# Patient Record
Sex: Male | Born: 1940 | Race: White | Hispanic: No | Marital: Single | Smoking: Former smoker
Health system: Southern US, Community
[De-identification: ages and names within clinical notes are randomized; demographics above are authoritative.]

## PROBLEM LIST (undated history)

## (undated) DIAGNOSIS — E785 Hyperlipidemia, unspecified: Secondary | ICD-10-CM

## (undated) DIAGNOSIS — I10 Essential (primary) hypertension: Secondary | ICD-10-CM

## (undated) DIAGNOSIS — I2510 Atherosclerotic heart disease of native coronary artery without angina pectoris: Secondary | ICD-10-CM

## (undated) DIAGNOSIS — M1990 Unspecified osteoarthritis, unspecified site: Secondary | ICD-10-CM

## (undated) DIAGNOSIS — I219 Acute myocardial infarction, unspecified: Secondary | ICD-10-CM

## (undated) HISTORY — PX: APPENDECTOMY: SHX54

## (undated) HISTORY — PX: CARDIAC CATHETERIZATION: SHX172

## (undated) HISTORY — PX: CORONARY ANGIOPLASTY: SHX604

## (undated) HISTORY — PX: TONSILLECTOMY: SUR1361

## (undated) HISTORY — PX: CORONARY ARTERY BYPASS GRAFT: SHX141

---

## 1999-06-10 DIAGNOSIS — I219 Acute myocardial infarction, unspecified: Secondary | ICD-10-CM | POA: Insufficient documentation

## 1999-06-10 HISTORY — DX: Acute myocardial infarction, unspecified: I21.9

## 2015-11-25 DIAGNOSIS — M25569 Pain in unspecified knee: Secondary | ICD-10-CM

## 2015-11-25 HISTORY — DX: Pain in unspecified knee: M25.569

## 2015-11-29 DIAGNOSIS — I452 Bifascicular block: Secondary | ICD-10-CM | POA: Insufficient documentation

## 2015-11-29 HISTORY — DX: Bifascicular block: I45.2

## 2015-12-09 DIAGNOSIS — Z01810 Encounter for preprocedural cardiovascular examination: Secondary | ICD-10-CM | POA: Insufficient documentation

## 2015-12-09 HISTORY — DX: Encounter for preprocedural cardiovascular examination: Z01.810

## 2015-12-17 ENCOUNTER — Other Ambulatory Visit: Payer: Self-pay | Admitting: Orthopedic Surgery

## 2016-01-12 NOTE — Pre-Procedure Instructions (Signed)
Gabriel MccoyGeorge N Wade  01/12/2016     Your procedure is scheduled on : Monday January 24, 2016 at 9:35 AM.  [facility] to [facility] Admitting at 7:35 AM.  Call this number if you have problems the morning of surgery: [phone]    Remember:  Do not eat food or drink liquids after midnight.  Take these medicines the morning of surgery with A SIP OF WATER : NONE   Stop taking any vitamins, herbal medications, NSAIDs, Ibuprofen, Advil, Motrin, Aleve, Omega 3/Fish Oil, Ubiquinol, etc on Monday July 10th   Do not wear jewelry.  Do not wear lotions, powders, or cologne.    Men may shave face and neck.  Do not bring valuables to the hospital.  [facility] is not responsible for any belongings or valuables.  Contacts, dentures or bridgework may not be worn into surgery.  Leave your suitcase in the car.  After surgery it may be brought to your room.  For patients admitted to the hospital, discharge time will be determined by your treatment team.  Patients discharged the day of surgery will not be allowed to drive home.   Name and phone number of your driver:    Special instructions:  Shower using CHG soap the night before and the morning of your surgery  Please read over the following fact sheets that you were given. MRSA Information

## 2016-01-13 ENCOUNTER — Encounter (HOSPITAL_COMMUNITY)
Admission: RE | Admit: 2016-01-13 | Discharge: 2016-01-13 | Disposition: A | Discharge status: Home / Self Care | Payer: Medicare Other | Source: Ambulatory Visit | Attending: Orthopedic Surgery | Admitting: Orthopedic Surgery

## 2016-01-13 ENCOUNTER — Encounter (HOSPITAL_COMMUNITY): Payer: Self-pay

## 2016-01-13 ENCOUNTER — Ambulatory Visit (HOSPITAL_COMMUNITY)
Admission: RE | Admit: 2016-01-13 | Discharge: 2016-01-13 | Disposition: A | Discharge status: Home / Self Care | Payer: Medicare Other | Source: Ambulatory Visit | Attending: Orthopedic Surgery | Admitting: Orthopedic Surgery

## 2016-01-13 DIAGNOSIS — Z01818 Encounter for other preprocedural examination: Secondary | ICD-10-CM

## 2016-01-13 DIAGNOSIS — Z01812 Encounter for preprocedural laboratory examination: Secondary | ICD-10-CM | POA: Insufficient documentation

## 2016-01-13 HISTORY — DX: Essential (primary) hypertension: I10

## 2016-01-13 HISTORY — DX: Unspecified osteoarthritis, unspecified site: M19.90

## 2016-01-13 HISTORY — DX: Acute myocardial infarction, unspecified: I21.9

## 2016-01-13 HISTORY — DX: Hyperlipidemia, unspecified: E78.5

## 2016-01-13 HISTORY — DX: Atherosclerotic heart disease of native coronary artery without angina pectoris: I25.10

## 2016-01-13 LAB — COMPREHENSIVE METABOLIC PANEL
ALBUMIN: 4 g/dL (ref 3.5–5.0)
ALT: 23 U/L (ref 17–63)
AST: 30 U/L (ref 15–41)
Alkaline Phosphatase: 45 U/L (ref 38–126)
Anion gap: 10 (ref 5–15)
BILIRUBIN TOTAL: 0.8 mg/dL (ref 0.3–1.2)
BUN: 12 mg/dL (ref 6–20)
CHLORIDE: 102 mmol/L (ref 101–111)
CO2: 27 mmol/L (ref 22–32)
CREATININE: 1.15 mg/dL (ref 0.61–1.24)
Calcium: 9.8 mg/dL (ref 8.9–10.3)
GFR calc Af Amer: >60 mL/min (ref >60)
GLUCOSE: 128 mg/dL — AB (ref 65–99)
POTASSIUM: 3.7 mmol/L (ref 3.5–5.1)
Sodium: 139 mmol/L (ref 135–145)
Total Protein: 7.3 g/dL (ref 6.5–8.1)

## 2016-01-13 LAB — CBC WITH DIFFERENTIAL/PLATELET
BASOS ABS: 0 10*3/uL (ref 0.0–0.1)
BASOS PCT: 0 %
Eosinophils Absolute: 0.2 10*3/uL (ref 0.0–0.7)
Eosinophils Relative: 4 %
HEMATOCRIT: 40.2 % (ref 39.0–52.0)
Hemoglobin: 13.4 g/dL (ref 13.0–17.0)
LYMPHS PCT: 23 %
Lymphs Abs: 1.1 10*3/uL (ref 0.7–4.0)
MCH: 29.8 pg (ref 26.0–34.0)
MCHC: 33.3 g/dL (ref 30.0–36.0)
MCV: 89.5 fL (ref 78.0–100.0)
Monocytes Absolute: 0.4 10*3/uL (ref 0.1–1.0)
Monocytes Relative: 9 %
NEUTROS ABS: 3.3 10*3/uL (ref 1.7–7.7)
Neutrophils Relative %: 64 %
PLATELETS: 226 10*3/uL (ref 150–400)
RBC: 4.49 MIL/uL (ref 4.22–5.81)
RDW: 13.2 % (ref 11.5–15.5)
WBC: 5 10*3/uL (ref 4.0–10.5)

## 2016-01-13 LAB — URINALYSIS, ROUTINE W REFLEX MICROSCOPIC
Bilirubin Urine: NEGATIVE
GLUCOSE, UA: NEGATIVE mg/dL
Hgb urine dipstick: NEGATIVE
KETONES UR: NEGATIVE mg/dL
NITRITE: NEGATIVE
PH: 7 (ref 5.0–8.0)
Protein, ur: NEGATIVE mg/dL
SPECIFIC GRAVITY, URINE: 1.017 (ref 1.005–1.030)

## 2016-01-13 LAB — URINE MICROSCOPIC-ADD ON
BACTERIA UA: NONE SEEN
RBC / HPF: NONE SEEN RBC/hpf (ref 0–5)
Squamous Epithelial / LPF: NONE SEEN

## 2016-01-13 LAB — SURGICAL PCR SCREEN
MRSA, PCR: NEGATIVE
STAPHYLOCOCCUS AUREUS: NEGATIVE

## 2016-01-13 LAB — PROTIME-INR
INR: 1.05 (ref 0.00–1.49)
PROTHROMBIN TIME: 13.9 s (ref 11.6–15.2)

## 2016-01-13 LAB — APTT: APTT: 27 s (ref 24–37)

## 2016-01-13 NOTE — Progress Notes (Signed)
PCP is [name], DO  Cardiologist is Norman Herr[place]Brian Munley [place]. Patient informed Nurse that he recently had a OV and EKG performed at Dr. Osker MasonMunely's office. Will request records.  Patient informed Nurse that he had a MI and CABG in 2000 at [facility]. Patient denied having any acute cardiac or pulmonary issues  Nurse inquired about aspirin, and patient informed Nurse that Dr. Dulce SellarMunley instructed patient to stop taking aspirin one week prior to surgery  Will send chart to Anesthesia for review

## 2016-01-14 LAB — URINE CULTURE: CULTURE: NO GROWTH

## 2016-01-14 NOTE — Progress Notes (Addendum)
Anesthesia Chart Review:  Pt is a 75 year old male scheduled for R unicompartmental knee on 01/24/2016 with Dannielle HuhSteve Lucey, MD.   PMH includes:  CAD (s/p MI, CABG 2001), HTN, hyperlipidemia. Never smoker. BMI 28  Medications include: ASA, hctz, lisinopril, simvastatin  Preoperative labs reviewed.    Chest x-ray 01/13/16 reviewed. No active cardiopulmonary disease  EKG 12/10/15: sinus rhythm. RBBB. Anterior infarct, age undetermined.   Pt does not routinely see cardiology but saw Norman HerrlichBrian Munley, MD (care everywhere) and was cleared for surgery at low risk.   Exercise stress test 08/26/13: Baseline NSR with RBBB. Pt exercised 3.5 min BRUCE to HR 146/min, >100% MPHR. No angina or ST depression. Normal BP response. Occasional PVC.   If no changes, I anticipate pt can proceed with surgery as scheduled.   Rica Mastngela Zoha Spranger, FNP-BC [facility]/Anesthesiology Phone: [phone] 01/17/2016 12:20 PM

## 2016-01-21 MED ORDER — CEFAZOLIN SODIUM-DEXTROSE 2-4 GM/100ML-% IV SOLN
2.0000 g | INTRAVENOUS | Status: AC
  Administered 2016-01-24: 2 g via INTRAVENOUS
  Filled 2016-01-21: qty 100

## 2016-01-21 MED ORDER — BUPIVACAINE LIPOSOME 1.3 % IJ SUSP
20.0000 mL | INTRAMUSCULAR | Status: AC
  Administered 2016-01-24: 20 mL
  Filled 2016-01-21: qty 20

## 2016-01-21 MED ORDER — TRANEXAMIC ACID 1000 MG/10ML IV SOLN
1000.0000 mg | INTRAVENOUS | Status: AC
  Administered 2016-01-24: 1000 mg via INTRAVENOUS
  Filled 2016-01-21: qty 10

## 2016-01-21 MED ORDER — ACETAMINOPHEN 500 MG PO TABS
1000.0000 mg | ORAL_TABLET | Freq: Once | ORAL | Status: AC
  Administered 2016-01-24: 1000 mg via ORAL
  Filled 2016-01-21: qty 2

## 2016-01-21 MED ORDER — SODIUM CHLORIDE 0.9 % IV SOLN: INTRAVENOUS | Status: DC

## 2016-01-24 ENCOUNTER — Ambulatory Visit (HOSPITAL_COMMUNITY): Payer: Medicare Other | Admitting: Anesthesiology

## 2016-01-24 ENCOUNTER — Encounter (HOSPITAL_COMMUNITY)
Admission: RE | Disposition: A | Discharge status: Home / Self Care | Payer: Self-pay | Source: Ambulatory Visit | Attending: Orthopedic Surgery

## 2016-01-24 ENCOUNTER — Encounter (HOSPITAL_COMMUNITY): Payer: Self-pay | Admitting: General Practice

## 2016-01-24 ENCOUNTER — Ambulatory Visit (HOSPITAL_COMMUNITY)
Admission: RE | Admit: 2016-01-24 | Discharge: 2016-01-24 | Disposition: A | Discharge status: Home / Self Care | Payer: Medicare Other | Source: Ambulatory Visit | Attending: Orthopedic Surgery | Admitting: Orthopedic Surgery

## 2016-01-24 ENCOUNTER — Ambulatory Visit (HOSPITAL_COMMUNITY): Payer: Medicare Other | Admitting: Emergency Medicine

## 2016-01-24 DIAGNOSIS — Z79899 Other long term (current) drug therapy: Secondary | ICD-10-CM | POA: Insufficient documentation

## 2016-01-24 DIAGNOSIS — M1711 Unilateral primary osteoarthritis, right knee: Secondary | ICD-10-CM | POA: Diagnosis present

## 2016-01-24 DIAGNOSIS — I2510 Atherosclerotic heart disease of native coronary artery without angina pectoris: Secondary | ICD-10-CM | POA: Insufficient documentation

## 2016-01-24 DIAGNOSIS — Z955 Presence of coronary angioplasty implant and graft: Secondary | ICD-10-CM | POA: Diagnosis not present

## 2016-01-24 DIAGNOSIS — I10 Essential (primary) hypertension: Secondary | ICD-10-CM | POA: Insufficient documentation

## 2016-01-24 DIAGNOSIS — Z96651 Presence of right artificial knee joint: Secondary | ICD-10-CM

## 2016-01-24 DIAGNOSIS — E785 Hyperlipidemia, unspecified: Secondary | ICD-10-CM | POA: Diagnosis not present

## 2016-01-24 DIAGNOSIS — I252 Old myocardial infarction: Secondary | ICD-10-CM | POA: Insufficient documentation

## 2016-01-24 HISTORY — PX: PARTIAL KNEE ARTHROPLASTY: SHX2174

## 2016-01-24 HISTORY — DX: Presence of right artificial knee joint: Z96.651

## 2016-01-24 SURGERY — ARTHROPLASTY, KNEE, UNICOMPARTMENTAL
Anesthesia: Monitor Anesthesia Care | Site: Knee | Laterality: Right

## 2016-01-24 MED ORDER — ONDANSETRON HCL 4 MG/2ML IJ SOLN
INTRAMUSCULAR | Status: AC
  Filled 2016-01-24: qty 2

## 2016-01-24 MED ORDER — SODIUM CHLORIDE 0.9 % IR SOLN
Status: DC | PRN
  Administered 2016-01-24: 3000 mL

## 2016-01-24 MED ORDER — MIDAZOLAM HCL 2 MG/2ML IJ SOLN
INTRAMUSCULAR | Status: AC
  Filled 2016-01-24: qty 2

## 2016-01-24 MED ORDER — TIZANIDINE HCL 4 MG PO TABS: 4.0000 mg | ORAL_TABLET | Freq: Four times a day (QID) | ORAL | Status: DC | PRN

## 2016-01-24 MED ORDER — FENTANYL CITRATE (PF) 250 MCG/5ML IJ SOLN
INTRAMUSCULAR | Status: AC
  Filled 2016-01-24: qty 5

## 2016-01-24 MED ORDER — PROPOFOL 500 MG/50ML IV EMUL
INTRAVENOUS | Status: DC | PRN
  Administered 2016-01-24: 90 ug/kg/min via INTRAVENOUS

## 2016-01-24 MED ORDER — EPHEDRINE SULFATE 50 MG/ML IJ SOLN
INTRAMUSCULAR | Status: DC | PRN
  Administered 2016-01-24: 5 mg via INTRAVENOUS

## 2016-01-24 MED ORDER — PHENYLEPHRINE HCL 10 MG/ML IJ SOLN
INTRAMUSCULAR | Status: DC | PRN
  Administered 2016-01-24 (×5): 80 ug via INTRAVENOUS

## 2016-01-24 MED ORDER — OXYCODONE HCL 5 MG PO TABS: 5.0000 mg | ORAL_TABLET | ORAL | Status: DC | PRN

## 2016-01-24 MED ORDER — 0.9 % SODIUM CHLORIDE (POUR BTL) OPTIME
TOPICAL | Status: DC | PRN
  Administered 2016-01-24: 1000 mL

## 2016-01-24 MED ORDER — PROPOFOL 10 MG/ML IV BOLUS
INTRAVENOUS | Status: AC
  Filled 2016-01-24: qty 20

## 2016-01-24 MED ORDER — LACTATED RINGERS IV SOLN
INTRAVENOUS | Status: DC
  Administered 2016-01-24 (×3): via INTRAVENOUS

## 2016-01-24 MED ORDER — CHLORHEXIDINE GLUCONATE 4 % EX LIQD: 60.0000 mL | Freq: Once | CUTANEOUS | Status: DC

## 2016-01-24 MED ORDER — PHENYLEPHRINE HCL 10 MG/ML IJ SOLN
10.0000 mg | INTRAMUSCULAR | Status: DC | PRN
Start: 2016-01-24 — End: 2016-01-24
  Administered 2016-01-24: 50 ug/min via INTRAVENOUS

## 2016-01-24 MED ORDER — BUPIVACAINE HCL 0.25 % IJ SOLN: INTRAMUSCULAR | Status: DC | PRN

## 2016-01-24 MED ORDER — HYDROMORPHONE HCL 1 MG/ML IJ SOLN: 0.2500 mg | INTRAMUSCULAR | Status: DC | PRN

## 2016-01-24 MED ORDER — PROMETHAZINE HCL 25 MG/ML IJ SOLN: 6.2500 mg | INTRAMUSCULAR | Status: DC | PRN

## 2016-01-24 MED ORDER — ARTIFICIAL TEARS OP OINT
TOPICAL_OINTMENT | OPHTHALMIC | Status: AC
  Filled 2016-01-24: qty 3.5

## 2016-01-24 MED ORDER — DIPHENHYDRAMINE HCL 50 MG/ML IJ SOLN
INTRAMUSCULAR | Status: DC | PRN
  Administered 2016-01-24: 25 mg via INTRAVENOUS

## 2016-01-24 MED ORDER — SODIUM CHLORIDE 0.9 % IJ SOLN
INTRAMUSCULAR | Status: DC | PRN
Start: 2016-01-24 — End: 2016-01-24
  Administered 2016-01-24: 20 mL

## 2016-01-24 MED ORDER — BUPIVACAINE-EPINEPHRINE 0.25% -1:200000 IJ SOLN
INTRAMUSCULAR | Status: DC | PRN
Start: 2016-01-24 — End: 2016-01-24
  Administered 2016-01-24: 30 mL

## 2016-01-24 MED ORDER — MIDAZOLAM HCL 5 MG/5ML IJ SOLN
INTRAMUSCULAR | Status: DC | PRN
  Administered 2016-01-24: 2 mg via INTRAVENOUS

## 2016-01-24 MED ORDER — BUPIVACAINE HCL (PF) 0.25 % IJ SOLN
INTRAMUSCULAR | Status: AC
  Filled 2016-01-24: qty 30

## 2016-01-24 MED ORDER — BUPIVACAINE-EPINEPHRINE (PF) 0.25% -1:200000 IJ SOLN
INTRAMUSCULAR | Status: AC
  Filled 2016-01-24: qty 30

## 2016-01-24 MED ORDER — ASPIRIN 325 MG PO TABS: 325.0000 mg | ORAL_TABLET | Freq: Every day | ORAL | Status: DC

## 2016-01-24 SURGICAL SUPPLY — 74 items
BANDAGE ELASTIC 6 VELCRO ST LF (GAUZE/BANDAGES/DRESSINGS) ×3 IMPLANT
BANDAGE ESMARK 6X9 LF (GAUZE/BANDAGES/DRESSINGS) ×1 IMPLANT
BEARING TIB UKA IBAL SZ3 10 (Knees) ×2 IMPLANT
BLADE SAW RECIP 87.9 MT (BLADE) IMPLANT
BLADE SAW SGTL 13X75X1.27 (BLADE) IMPLANT
BLADE SAW SGTL 83.5X18.5 (BLADE) IMPLANT
BNDG ELASTIC 6X10 VLCR STRL LF (GAUZE/BANDAGES/DRESSINGS) ×3 IMPLANT
BNDG ESMARK 6X9 LF (GAUZE/BANDAGES/DRESSINGS) ×3
BOWL SMART MIX CTS (DISPOSABLE) ×3 IMPLANT
CEMENT BONE SIMPLEX SPEEDSET (Cement) ×3 IMPLANT
CLOSURE WOUND 1/2 X4 (GAUZE/BANDAGES/DRESSINGS) ×1
COVER SURGICAL LIGHT HANDLE (MISCELLANEOUS) ×3 IMPLANT
CUFF TOURNIQUET SINGLE 34IN LL (TOURNIQUET CUFF) ×3 IMPLANT
DRAPE EXTREMITY T 121X128X90 (DRAPE) ×3 IMPLANT
DRAPE IMP U-DRAPE 54X76 (DRAPES) ×3 IMPLANT
DRAPE INCISE IOBAN 66X45 STRL (DRAPES) ×9 IMPLANT
DRAPE PROXIMA HALF (DRAPES) ×3 IMPLANT
DRAPE U-SHAPE 47X51 STRL (DRAPES) ×3 IMPLANT
DRSG ADAPTIC 3X8 NADH LF (GAUZE/BANDAGES/DRESSINGS) IMPLANT
DRSG AQUACEL AG ADV 3.5X10 (GAUZE/BANDAGES/DRESSINGS) ×3 IMPLANT
DRSG PAD ABDOMINAL 8X10 ST (GAUZE/BANDAGES/DRESSINGS) IMPLANT
DURAPREP 26ML APPLICATOR (WOUND CARE) ×6 IMPLANT
ELECT REM PT RETURN 9FT ADLT (ELECTROSURGICAL) ×3
ELECTRODE REM PT RTRN 9FT ADLT (ELECTROSURGICAL) ×1 IMPLANT
EVACUATOR 1/8 PVC DRAIN (DRAIN) IMPLANT
FEMORAL UKA IBAL RMED LLAT SZ3 (Knees) ×3 IMPLANT
FLUID NSS /IRRIG 3000 ML XXX (IV SOLUTION) ×3 IMPLANT
GAUZE SPONGE 4X4 12PLY STRL (GAUZE/BANDAGES/DRESSINGS) IMPLANT
GLOVE BIO SURGEON STRL SZ7 (GLOVE) ×3 IMPLANT
GLOVE BIOGEL M 7.0 STRL (GLOVE) IMPLANT
GLOVE BIOGEL PI IND STRL 6.5 (GLOVE) ×2 IMPLANT
GLOVE BIOGEL PI IND STRL 7.0 (GLOVE) ×1 IMPLANT
GLOVE BIOGEL PI IND STRL 7.5 (GLOVE) IMPLANT
GLOVE BIOGEL PI IND STRL 8.5 (GLOVE) ×4 IMPLANT
GLOVE BIOGEL PI INDICATOR 6.5 (GLOVE) ×4
GLOVE BIOGEL PI INDICATOR 7.0 (GLOVE) ×2
GLOVE BIOGEL PI INDICATOR 7.5 (GLOVE)
GLOVE BIOGEL PI INDICATOR 8.5 (GLOVE) ×8
GLOVE SURG ORTHO 8.0 STRL STRW (GLOVE) ×12 IMPLANT
GLOVE SURG SS PI 7.0 STRL IVOR (GLOVE) ×3 IMPLANT
GOWN STRL REUS W/ TWL LRG LVL3 (GOWN DISPOSABLE) ×2 IMPLANT
GOWN STRL REUS W/ TWL XL LVL3 (GOWN DISPOSABLE) ×1 IMPLANT
GOWN STRL REUS W/TWL 2XL LVL3 (GOWN DISPOSABLE) ×3 IMPLANT
GOWN STRL REUS W/TWL LRG LVL3 (GOWN DISPOSABLE) ×4
GOWN STRL REUS W/TWL XL LVL3 (GOWN DISPOSABLE) ×2
HANDPIECE INTERPULSE COAX TIP (DISPOSABLE) ×2
HOOD PEEL AWAY FACE SHEILD DIS (HOOD) ×9 IMPLANT
KIT BASIN OR (CUSTOM PROCEDURE TRAY) ×3 IMPLANT
KIT ROOM TURNOVER OR (KITS) ×3 IMPLANT
MANIFOLD NEPTUNE II (INSTRUMENTS) ×3 IMPLANT
NEEDLE 21X1 OR PACK (NEEDLE) ×3 IMPLANT
NEEDLE HYPO 21X1 ECLIPSE (NEEDLE) ×3 IMPLANT
NS IRRIG 1000ML POUR BTL (IV SOLUTION) ×3 IMPLANT
PACK TOTAL JOINT (CUSTOM PROCEDURE TRAY) ×3 IMPLANT
PACK UNIVERSAL I (CUSTOM PROCEDURE TRAY) ×3 IMPLANT
PAD ARMBOARD 7.5X6 YLW CONV (MISCELLANEOUS) ×6 IMPLANT
PADDING CAST COTTON 6X4 STRL (CAST SUPPLIES) IMPLANT
SET HNDPC FAN SPRY TIP SCT (DISPOSABLE) ×1 IMPLANT
STAPLER VISISTAT 35W (STAPLE) IMPLANT
STRIP CLOSURE SKIN 1/2X4 (GAUZE/BANDAGES/DRESSINGS) ×2 IMPLANT
SUCTION FRAZIER HANDLE 10FR (MISCELLANEOUS) ×2
SUCTION TUBE FRAZIER 10FR DISP (MISCELLANEOUS) ×1 IMPLANT
SUT MNCRL AB 3-0 PS2 18 (SUTURE) ×3 IMPLANT
SUT VIC AB 0 CT1 27 (SUTURE) ×4
SUT VIC AB 0 CT1 27XBRD ANBCTR (SUTURE) ×2 IMPLANT
SUT VIC AB 1 CT1 27 (SUTURE) ×4
SUT VIC AB 1 CT1 27XBRD ANBCTR (SUTURE) ×2 IMPLANT
SUT VIC AB 2-0 CT1 27 (SUTURE) ×4
SUT VIC AB 2-0 CT1 TAPERPNT 27 (SUTURE) ×2 IMPLANT
SYR 20CC LL (SYRINGE) ×6 IMPLANT
TOWEL OR 17X24 6PK STRL BLUE (TOWEL DISPOSABLE) ×3 IMPLANT
TOWEL OR 17X26 10 PK STRL BLUE (TOWEL DISPOSABLE) ×3 IMPLANT
TRAY CATH 16FR W/PLASTIC CATH (SET/KITS/TRAYS/PACK) ×3 IMPLANT
TRAY TIBIA COMP SZ3 IBALUKA RT (Knees) ×2 IMPLANT

## 2016-01-24 NOTE — Transfer of Care (Signed)
Immediate Anesthesia Transfer of Care Note  Patient: Gabriel MccoyGeorge N Wade  Procedure(s) Performed: Procedure(s): UNICOMPARTMENTAL KNEE (Right)  Patient Location: PACU  Anesthesia Type:MAC and Spinal  Level of Consciousness: awake, alert  and oriented  Airway & Oxygen Therapy: Patient Spontanous Breathing  Post-op Assessment: Report given to RN and Post -op Vital signs reviewed and stable  Post vital signs: Reviewed and stable  Last Vitals:  Filed Vitals:   01/24/16 1257 01/24/16 1300  BP: 94/58   Pulse: 88 90  Temp:    Resp:  26    Last Pain:  Filed Vitals:   01/24/16 1301  PainSc: 3       Patients Stated Pain Goal: 4 (01/24/16 [id number])  Complications: No apparent anesthesia complications

## 2016-01-24 NOTE — Anesthesia Preprocedure Evaluation (Signed)
Anesthesia Evaluation  Patient identified by MRN, date of birth, ID band Patient awake    Reviewed: Allergy & Precautions, NPO status , Patient's Chart, lab work & pertinent test results  History of Anesthesia Complications Negative for: history of anesthetic complications  Airway Mallampati: II  TM Distance: >3 FB Neck ROM: Full    Dental  (+) Teeth Intact, Dental Advisory Given   Pulmonary neg pulmonary ROS,    Pulmonary exam normal        Cardiovascular hypertension, + CAD, + Past MI and + CABG  Normal cardiovascular exam     Neuro/Psych negative neurological ROS  negative psych ROS   GI/Hepatic negative GI ROS, Neg liver ROS,   Endo/Other  negative endocrine ROS  Renal/GU negative Renal ROS     Musculoskeletal   Abdominal   Peds  Hematology   Anesthesia Other Findings   Reproductive/Obstetrics                             Anesthesia Physical Anesthesia Plan  ASA: III  Anesthesia Plan: MAC and Spinal   Post-op Pain Management:    Induction: Intravenous  Airway Management Planned: Simple Face Mask  Additional Equipment:   Intra-op Plan:   Post-operative Plan:   Informed Consent: I have reviewed the patients History and Physical, chart, labs and discussed the procedure including the risks, benefits and alternatives for the proposed anesthesia with the patient or authorized representative who has indicated his/her understanding and acceptance.   Dental advisory given  Plan Discussed with: CRNA, Anesthesiologist and Surgeon  Anesthesia Plan Comments:         Anesthesia Quick Evaluation

## 2016-01-24 NOTE — H&P (Signed)
Gabriel Wade MRN:  [id number] DOB/SEX:  1940/07/14/male  CHIEF COMPLAINT:  Painful right Knee  HISTORY: Patient is a 75 y.o. male presented with a history of pain in the right knee. Onset of symptoms was gradual starting a few years ago with gradually worsening course since that time. Patient has been treated conservatively with over-the-counter NSAIDs and activity modification. Patient currently rates pain in the knee at 10 out of 10 with activity. There is pain at night.  PAST MEDICAL HISTORY: There are no active problems to display for this patient.  Past Medical History  Diagnosis Date  . Coronary artery disease   . Hypertension   . Myocardial infarction [facility]) December 2000  . Hyperlipemia   . Arthritis    Past Surgical History  Procedure Laterality Date  . Appendectomy    . Coronary artery bypass graft    . Tonsillectomy    . Cardiac catheterization      X 1 stent  . Coronary angioplasty       MEDICATIONS:   No prescriptions prior to admission    ALLERGIES:   Allergies  Allergen Reactions  . Plavix [Clopidogrel Bisulfate] Itching and Rash    REVIEW OF SYSTEMS:  A comprehensive review of systems was negative except for: Musculoskeletal: positive for arthralgias, bone pain and stiff joints   FAMILY HISTORY:  No family history on file.  SOCIAL HISTORY:   Social History  Substance Use Topics  . Smoking status: Never Smoker   . Smokeless tobacco: Not on file  . Alcohol Use: No     EXAMINATION:  Vital signs in last 24 hours:    There were no vitals taken for this visit.  General Appearance:    Alert, cooperative, no distress, appears stated age  Head:    Normocephalic, without obvious abnormality, atraumatic  Eyes:    PERRL, conjunctiva/corneas clear, EOM's intact, fundi    benign, both eyes       Ears:    Normal TM's and external ear canals, both ears  Nose:   Nares normal, septum midline, mucosa normal, no drainage    or sinus tenderness  Throat:    Lips, mucosa, and tongue normal; teeth and gums normal  Neck:   Supple, symmetrical, trachea midline, no adenopathy;       thyroid:  No enlargement/tenderness/nodules; no carotid   bruit or JVD  Back:     Symmetric, no curvature, ROM normal, no CVA tenderness  Lungs:     Clear to auscultation bilaterally, respirations unlabored  Chest wall:    No tenderness or deformity  Heart:    Regular rate and rhythm, S1 and S2 normal, no murmur, rub   or gallop  Abdomen:     Soft, non-tender, bowel sounds active all four quadrants,    no masses, no organomegaly  Genitalia:    Normal male without lesion, discharge or tenderness  Rectal:    Normal tone, normal prostate, no masses or tenderness;   guaiac negative stool  Extremities:   Extremities normal, atraumatic, no cyanosis or edema  Pulses:   2+ and symmetric all extremities  Skin:   Skin color, texture, turgor normal, no rashes or lesions  Lymph nodes:   Cervical, supraclavicular, and axillary nodes normal  Neurologic:   CNII-XII intact. Normal strength, sensation and reflexes      throughout    Musculoskeletal:  ROM 0-120, Ligaments intact,  Imaging Review Plain radiographs demonstrate severe degenerative joint disease of the right knee medial compartment.  The overall alignment is mild varus. The bone quality appears to be excellent for age and reported activity level.  Assessment/Plan: Primary osteoarthritis, right knee medial compartment  The patient history, physical examination and imaging studies are consistent with advanced degenerative joint disease of the right knee medial compartment. The patient has failed conservative treatment.  The clearance notes were reviewed.  After discussion with the patient it was felt that Partial Knee Replacement was indicated. The procedure,  risks, and benefits of total knee arthroplasty were presented and reviewed. The risks including but not limited to aseptic loosening, infection, blood clots, vascular  injury, stiffness, patella tracking problems complications among others were discussed. The patient acknowledged the explanation, agreed to proceed with the plan.  Guy SandiferColby Alan Robbins 01/24/2016, 6:35 AM

## 2016-01-24 NOTE — Anesthesia Postprocedure Evaluation (Signed)
Anesthesia Post Note  Patient: Gabriel MccoyGeorge N Wade  Procedure(s) Performed: Procedure(s) (LRB): UNICOMPARTMENTAL KNEE (Right)  Patient location during evaluation: PACU Anesthesia Type: Spinal and MAC Level of consciousness: awake and alert Pain management: pain level controlled Vital Signs Assessment: post-procedure vital signs reviewed and stable Respiratory status: spontaneous breathing and respiratory function stable Cardiovascular status: blood pressure returned to baseline and stable Postop Assessment: spinal receding Anesthetic complications: no    Last Vitals:  Filed Vitals:   01/24/16 1330 01/24/16 1341  BP:  98/66  Pulse: 87 79  Temp:    Resp: 20 21    Last Pain:  Filed Vitals:   01/24/16 1346  PainSc: 0-No pain                 Aaira Oestreicher DANIEL

## 2016-01-24 NOTE — Anesthesia Procedure Notes (Signed)
Procedure Name: MAC Performed by: YACOUB, Karla Pavone B Pre-anesthesia Checklist: Patient identified, Emergency Drugs available, Suction available, Patient being monitored and Timeout performed Patient Re-evaluated:Patient Re-evaluated prior to inductionOxygen Delivery Method: Simple face mask Preoxygenation: Pre-oxygenation with 100% oxygen Intubation Type: IV induction Placement Confirmation: positive ETCO2 Dental Injury: Teeth and Oropharynx as per pre-operative assessment      

## 2016-01-25 ENCOUNTER — Encounter (HOSPITAL_COMMUNITY): Payer: Self-pay | Admitting: Orthopedic Surgery

## 2016-01-25 NOTE — Discharge Summary (Signed)
SPORTS MEDICINE & JOINT REPLACEMENT   Georgena Spurling, MD   Laurier Nancy, PA-C [address] [place], [place], [place]  [address]                             [phone]  PATIENT ID: Gabriel Wade        MRN:  [id number]          DOB/AGE: 74-Jun-1942 / 74 y.o.    DISCHARGE SUMMARY  ADMISSION DATE:    01/24/2016 DISCHARGE DATE:   01/24/2016   ADMISSION DIAGNOSIS: primary osteoarthritis right knee    DISCHARGE DIAGNOSIS:  primary osteoarthritis right knee    ADDITIONAL DIAGNOSIS: Active Problems:   * No active hospital problems. *  Past Medical History  Diagnosis Date  . Coronary artery disease   . Hypertension   . 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 Hyperlipemia   . Arthritis     PROCEDURE: Procedure(s): UNICOMPARTMENTAL KNEE on 01/24/2016  CONSULTS:     HISTORY:  See H&P in chart  HOSPITAL COURSE:  Gabriel Wade is a 75 y.o. admitted on 01/24/2016 and found to have a diagnosis of primary osteoarthritis right knee.  After appropriate laboratory studies were obtained  they were taken to the operating room on 01/24/2016 and underwent Procedure(s): UNICOMPARTMENTAL KNEE.   They were given perioperative antibiotics:  Anti-infectives    Start     Dose/Rate Route Frequency Ordered Stop   01/24/16 1000  ceFAZolin (ANCEF) IVPB 2g/100 mL premix     2 g 200 mL/hr over 30 Minutes Intravenous To ShortStay Surgical 01/21/16 1404 01/24/16 1107    .  Patient given tranexamic acid IV or topical and exparel intra-operatively.  Tolerated the procedure well.    POD# 1: Vital signs were stable.  Patient denied Chest pain, shortness of breath, or calf pain.  Patient was started on Lovenox 30 mg subcutaneously twice daily at 8am.  Consults to PT, OT, and care management were made.  The patient was weight bearing as tolerated.  CPM was placed on the operative leg 0-90 degrees for 6-8 hours a day. When out of the CPM, patient was placed in the foam block to achieve full  extension. Incentive spirometry was taught.  Dressing was changed.       POD #2, Continued  PT for ambulation and exercise program.  IV saline locked.  O2 discontinued.    The remainder of the hospital course was dedicated to ambulation and strengthening.   The patient was discharged on 01/24/2016 in  Good condition.  Blood products given:none  DIAGNOSTIC STUDIES: Recent vital signs: Patient Vitals for the past 24 hrs:  BP Temp Pulse Resp SpO2  01/24/16 1527 124/71 mmHg - - - 98 %  01/24/16 1515 - 97.3 F (36.3 C) 75 (!) 39 100 %  01/24/16 1514 110/69 mmHg - 77 - 100 %  01/24/16 1500 - - 74 - 99 %  01/24/16 1459 112/73 mmHg - 72 (!) 22 100 %  01/24/16 1445 - - 77 (!) 21 100 %  01/24/16 1444 111/74 mmHg - 75 (!) 26 99 %  01/24/16 1430 - - 76 (!) 24 100 %  01/24/16 1429 116/71 mmHg - 82 (!) 31 100 %  01/24/16 1412 105/70 mmHg - 73 14 100 %  01/24/16 1400 - - 81 15 100 %  01/24/16 1356 104/71 mmHg - 83 19 100 %  01/24/16 1345 - - 75 (!)  29 99 %  01/24/16 1341 98/66 mmHg - 79 (!) 21 100 %  01/24/16 1330 - - 87 20 98 %  01/24/16 1326 98/65 mmHg - 83 (!) 36 100 %  01/24/16 1321 (!) 93/59 mmHg - 84 (!) 36 100 %  01/24/16 1315 - - 89 (!) 34 99 %  01/24/16 1312 (!) 86/63 mmHg - 86 18 99 %  01/24/16 1300 - - 90 (!) 26 98 %  01/24/16 1257 (!) 94/58 mmHg 97.4 F (36.3 C) 88 - 100 %       Recent laboratory studies: No results for input(s): WBC, HGB, HCT, PLT in the last 168 hours. No results for input(s): NA, K, CL, CO2, BUN, CREATININE, GLUCOSE, CALCIUM in the last 168 hours. Lab Results  Component Value Date   INR 1.05 01/13/2016     Recent Radiographic Studies :  Dg Chest 2 View  01/13/2016  CLINICAL DATA:  Preop right knee surgery EXAM: CHEST  2 VIEW COMPARISON:  None FINDINGS: Previous median sternotomy and CABG procedure. The heart size and mediastinal contours are within normal limits. Both lungs are clear. The visualized skeletal structures are unremarkable. IMPRESSION: No  active cardiopulmonary disease. Electronically Signed   By: Signa Kell M.D.   On: 01/13/2016 11:04    DISCHARGE INSTRUCTIONS: Discharge Instructions    CPM    Complete by:  As directed   Continuous passive motion machine (CPM):      Use the CPM from 0 to 90 for 4-6 hours per day.      You may increase by 10 per day.  You may break it up into 2 or 3 sessions per day.      Use CPM for 2 weeks or until you are told to stop.     Call MD / Call 911    Complete by:  As directed   If you experience chest pain or shortness of breath, CALL 911 and be transported to the hospital emergency room.  If you develope a fever above 101 F, pus (white drainage) or increased drainage or redness at the wound, or calf pain, call your surgeon's office.     Constipation Prevention    Complete by:  As directed   Drink plenty of fluids.  Prune juice may be helpful.  You may use a stool softener, such as Colace (over the counter) 100 mg twice a day.  Use MiraLax (over the counter) for constipation as needed.     Diet - low sodium heart healthy    Complete by:  As directed      Discharge instructions    Complete by:  As directed   INSTRUCTIONS AFTER JOINT REPLACEMENT   Remove items at home which could result in a fall. This includes throw rugs or furniture in walking pathways ICE to the affected joint every three hours while awake for 30 minutes at a time, for at least the first 3-5 days, and then as needed for pain and swelling.  Continue to use ice for pain and swelling. You may notice swelling that will progress down to the foot and ankle.  This is normal after surgery.  Elevate your leg when you are not up walking on it.   Continue to use the breathing machine you got in the hospital (incentive spirometer) which will help keep your temperature down.  It is common for your temperature to cycle up and down following surgery, especially at night when you are not up moving around  and exerting yourself.  The  breathing machine keeps your lungs expanded and your temperature down.   DIET:  As you were doing prior to hospitalization, we recommend a well-balanced diet.  DRESSING / WOUND CARE / SHOWERING  Keep the surgical dressing on until follow up.  The dressing is water proof, so you can shower without any extra covering.  IF THE DRESSING FALLS OFF or the wound gets wet inside, change the dressing with sterile gauze.  Please use good hand washing techniques before changing the dressing.  Do not use any lotions or creams on the incision until instructed by your surgeon.    ACTIVITY  Increase activity slowly as tolerated, but follow the weight bearing instructions below.   No driving for 6 weeks or until further direction given by your physician.  You cannot drive while taking narcotics.  No lifting or carrying greater than 10 lbs. until further directed by your surgeon. Avoid periods of inactivity such as sitting longer than an hour when not asleep. This helps prevent blood clots.  You may return to work once you are authorized by your doctor.     WEIGHT BEARING   Weight bearing as tolerated with assist device (walker, cane, etc) as directed, use it as long as suggested by your surgeon or therapist, typically at least 4-6 weeks.   EXERCISES  Results after joint replacement surgery are often greatly improved when you follow the exercise, range of motion and muscle strengthening exercises prescribed by your doctor. Safety measures are also important to protect the joint from further injury. Any time any of these exercises cause you to have increased pain or swelling, decrease what you are doing until you are comfortable again and then slowly increase them. If you have problems or questions, call your caregiver or physical therapist for advice.   Rehabilitation is important following a joint replacement. After just a few days of immobilization, the muscles of the leg can become weakened and shrink  (atrophy).  These exercises are designed to build up the tone and strength of the thigh and leg muscles and to improve motion. Often times heat used for twenty to thirty minutes before working out will loosen up your tissues and help with improving the range of motion but do not use heat for the first two weeks following surgery (sometimes heat can increase post-operative swelling).   These exercises can be done on a training (exercise) mat, on the floor, on a table or on a bed. Use whatever works the best and is most comfortable for you.    Use music or television while you are exercising so that the exercises are a pleasant break in your day. This will make your life better with the exercises acting as a break in your routine that you can look forward to.   Perform all exercises about fifteen times, three times per day or as directed.  You should exercise both the operative leg and the other leg as well.   Exercises include:   Quad Sets - Tighten up the muscle on the front of the thigh (Quad) and hold for 5-10 seconds.   Straight Leg Raises - With your knee straight (if you were given a brace, keep it on), lift the leg to 60 degrees, hold for 3 seconds, and slowly lower the leg.  Perform this exercise against resistance later as your leg gets stronger.  Leg Slides: Lying on your back, slowly slide your foot toward your buttocks, bending your knee  up off the floor (only go as far as is comfortable). Then slowly slide your foot back down until your leg is flat on the floor again.  Angel Wings: Lying on your back spread your legs to the side as far apart as you can without causing discomfort.  Hamstring Strength:  Lying on your back, push your heel against the floor with your leg straight by tightening up the muscles of your buttocks.  Repeat, but this time bend your knee to a comfortable angle, and push your heel against the floor.  You may put a pillow under the heel to make it more comfortable if  necessary.   A rehabilitation program following joint replacement surgery can speed recovery and prevent re-injury in the future due to weakened muscles. Contact your doctor or a physical therapist for more information on knee rehabilitation.    CONSTIPATION  Constipation is defined medically as fewer than three stools per week and severe constipation as less than one stool per week.  Even if you have a regular bowel pattern at home, your normal regimen is likely to be disrupted due to multiple reasons following surgery.  Combination of anesthesia, postoperative narcotics, change in appetite and fluid intake all can affect your bowels.   YOU MUST use at least one of the following options; they are listed in order of increasing strength to get the job done.  They are all available over the counter, and you may need to use some, POSSIBLY even all of these options:    Drink plenty of fluids (prune juice may be helpful) and high fiber foods Colace 100 mg by mouth twice a day  Senokot for constipation as directed and as needed Dulcolax (bisacodyl), take with full glass of water  Miralax (polyethylene glycol) once or twice a day as needed.  If you have tried all these things and are unable to have a bowel movement in the first 3-4 days after surgery call either your surgeon or your primary doctor.    If you experience loose stools or diarrhea, hold the medications until you stool forms back up.  If your symptoms do not get better within 1 week or if they get worse, check with your doctor.  If you experience "the worst abdominal pain ever" or develop nausea or vomiting, please contact the office immediately for further recommendations for treatment.   ITCHING:  If you experience itching with your medications, try taking only a single pain pill, or even half a pain pill at a time.  You can also use Benadryl over the counter for itching or also to help with sleep.   TED HOSE STOCKINGS:  Use stockings  on both legs until for at least 2 weeks or as directed by physician office. They may be removed at night for sleeping.  MEDICATIONS:  See your medication summary on the "After Visit Summary" that nursing will review with you.  You may have some home medications which will be placed on hold until you complete the course of blood thinner medication.  It is important for you to complete the blood thinner medication as prescribed.  PRECAUTIONS:  If you experience chest pain or shortness of breath - call 911 immediately for transfer to the hospital emergency department.   If you develop a fever greater that 101 F, purulent drainage from wound, increased redness or drainage from wound, foul odor from the wound/dressing, or calf pain - CONTACT YOUR SURGEON.  FOLLOW-UP APPOINTMENTS:  If you do not already have a post-op appointment, please call the office for an appointment to be seen by your surgeon.  Guidelines for how soon to be seen are listed in your "After Visit Summary", but are typically between 1-4 weeks after surgery.  OTHER INSTRUCTIONS:   Knee Replacement:  Do not place pillow under knee, focus on keeping the knee straight while resting. CPM instructions: 0-90 degrees, 2 hours in the morning, 2 hours in the afternoon, and 2 hours in the evening. Place foam block, curve side up under heel at all times except when in CPM or when walking.  DO NOT modify, tear, cut, or change the foam block in any way.  MAKE SURE YOU:  Understand these instructions.  Get help right away if you are not doing well or get worse.    Thank you for letting us be a part of your medical care team.  It is a privilege we respect greatly.  We hope these instructions will help you stay on track for a fast and full recovery!     Driving restrictions    Complete by:  As directed   No driving for 4 weeks     Increase activity slowly as tolerated    Complete by:  As directed             DISCHARGE MEDICATIONS:     Medication List    STOP taking these medications        aspirin 325 MG EC tablet  Replaced by:  aspirin 325 MG tablet      TAKE these medications        aspirin 325 MG tablet  Take 1 tablet (325 mg total) by mouth daily.     Coconut Oil 1000 MG Caps  Take 1,000 mg by mouth daily.     Garlic 1000 MG Caps  Take 1,000 mg by mouth daily.     hydrochlorothiazide 25 MG tablet  Commonly known as:  HYDRODIURIL  Take 25 mg by mouth daily.     L-FORMULA LYSINE HCL 500 MG Tabs  Generic drug:  Lysine HCl  Take 500 mg by mouth 2 (two) times daily as needed (for onset of cold symptoms).     lisinopril 10 MG tablet  Commonly known as:  PRINIVIL,ZESTRIL  Take 10 mg by mouth daily.     multivitamin tablet  Take 1 tablet by mouth daily.     Omega-3 Fish Oil 1200 MG Caps  Take 1,200 mg by mouth daily.     oxyCODONE 5 MG immediate release tablet  Commonly known as:  Oxy IR/ROXICODONE  Take 1-2 tablets (5-10 mg total) by mouth every 4 (four) hours as needed for severe pain.     simvastatin 40 MG tablet  Commonly known as:  ZOCOR  Take 40 mg by mouth daily.     tiZANidine 4 MG tablet  Commonly known as:  ZANAFLEX  Take 1 tablet (4 mg total) by mouth every 6 (six) hours as needed for muscle spasms.     Ubiquinol 100 MG Caps  Take 100 mg by mouth 2 (two) times a week.     vitamin E 400 UNIT capsule  Generic drug:  vitamin E  Take 400 Units by mouth daily.        FOLLOW UP VISIT:    DISPOSITION: HOME VS. SNF  CONDITION:  Good   Guy Sandifer 01/25/2016, 10:56 AM

## 2016-01-25 NOTE — Op Note (Addendum)
TOTAL KNEE REPLACEMENT OPERATIVE NOTE:  01/24/2016  7:58 AM  PATIENT:  Gabriel Wade  75 y.o. male  PRE-OPERATIVE DIAGNOSIS:  primary osteoarthritis right knee  POST-OPERATIVE DIAGNOSIS:  primary osteoarthritis right knee  PROCEDURE:  Procedure(s): UNICOMPARTMENTAL KNEE  SURGEON:  Surgeon(s): Dannielle Huh, MD  PHYSICIAN ASSISTANT: Laurier Nancy, PA-C   ANESTHESIA:   spinal  DRAINS: Hemovac  SPECIMEN: None  COUNTS:  Correct  TOURNIQUET:   Total Tourniquet Time Documented: Thigh (Right) - 50 minutes Total: Thigh (Right) - 50 minutes   DICTATION:  Indication for procedure:    The patient is a 75 y.o. male who has failed conservative treatment for primary osteoarthritis right knee.  Informed consent was obtained prior to anesthesia. The risks versus benefits of the operation were explain and in a way the patient can, and did, understand.   On the implant demand matching protocol, this patient scored 10.  Therefore, this patient did" "did not receive a polyethylene insert with vitamin E which is a high demand implant.  Description of procedure:     The patient was taken to the operating room and placed under anesthesia.  The patient was positioned in the usual fashion taking care that all body parts were adequately padded and/or protected.  I foley catheter was not placed.  A tourniquet was applied and the leg prepped and draped in the usual sterile fashion.  The extremity was exsanguinated with the esmarch and tourniquet inflated to 350 mmHg.  Pre-operative range of motion was 0-90 degrees flexion.  The knee was in 5 degree of mild varus.  A midline incision approximately 6-7 inches long was made with a #10 blade.  A new blade was used to make a parapatellar arthrotomy going 2-3 cm into the quadriceps tendon, over the patella, and alongside the medial aspect of the patellar tendon.  A synovectomy was then performed with the #10 blade and forceps. I then elevated the deep  MCL off the medial tibial metaphysis subperiosteally around to the semimembranosus attachment.    I everted the patella and used calipers to measure patellar thickness.  I used the reamer to ream down to appropriate thickness to recreate the native thickness.  I then removed excess bone with the rongeur and sagittal saw.  I used the appropriately sized template and drilled the three lug holes.  I then put the trial in place and measured the thickness with the calipers to ensure recreation of the native thickness.  The trial was then removed and the patella subluxed and the knee brought into flexion.  A homan retractor was place to retract and protect the patella and lateral structures.  A Z-retractor was place medially to protect the medial structures.  The extra-medullary alignment system was used to make cut the tibial articular surface perpendicular to the anamotic axis of the tibia and in 3 degrees of posterior slope.  The cut surface and alignment jig was removed.  I then used the intramedullary alignment guide to make a 6 valgus cut on the distal femur.  I then marked out the epicondylar axis on the distal femur.  The posterior condylar axis measured 3 degrees.  I then used the anterior referencing sizer and measured the femur to be a size 8.  The 4-In-1 cutting block was screwed into place in external rotation matching the posterior condylar angle, making our cuts perpendicular to the epicondylar axis.  Anterior, posterior and chamfer cuts were made with the sagittal saw.  The cutting block and  cut pieces were removed.  A lamina spreader was placed in 90 degrees of flexion.  The ACL, PCL, menisci, and posterior condylar osteophytes were removed.  A 10 mm spacer blocked was found to offer good flexion and extension gap balance after mild in degree releasing.   The scoop retractor was then placed and the femoral finishing block was pinned in place.  The small sagittal saw was used as well as the lug  drill to finish the femur.  The block and cut surfaces were removed and the medullary canal hole filled with autograft bone from the cut pieces.  The tibia was delivered forward in deep flexion and external rotation.  A size F tray was selected and pinned into place centered on the medial 1/3 of the tibial tubercle.  The reamer and keel was used to prepare the tibia through the tray.    I then trialed with the size 8 femur, size F tibia, a 10 mm insert and the 35 patella.  I had excellent flexion/extension gap balance, excellent patella tracking.  Flexion was full and beyond 120 degrees; extension was zero.  These components were chosen and the staff opened them to me on the back table while the knee was lavaged copiously and the cement mixed.  The soft tissue was infiltrated with 60cc of exparel 1.3% through a 21 gauge needle.  I cemented in the components and removed all excess cement.  The polyethylene tibial component was snapped into place and the knee placed in extension while cement was hardening.  The capsule was infilltrated with 30cc of .25% Marcaine with epinephrine.  A hemovac was place in the joint exiting superolaterally.  A pain pump was place superomedially superficial to the arthrotomy.  Once the cement was hard, the tourniquet was let down.  Hemostasis was obtained.  The arthrotomy was closed with figure-8 #1 vicryl sutures.  The deep soft tissues were closed with #0 vicryls and the subcuticular layer closed with a running #2-0 vicryl.  The skin was reapproximated and closed with skin staples.  The wound was dressed with xeroform, 4 x4's, 2 ABD sponges, a single layer of webril and a TED stocking.   The patient was then awakened, extubated, and taken to the recovery room in stable condition.  BLOOD LOSS:  300cc DRAINS: 1 hemovac, 1 pain catheter COMPLICATIONS:  None.  PLAN OF CARE: Admit to inpatient   PATIENT DISPOSITION:  PACU - hemodynamically stable.   Delay start of  Pharmacological VTE agent (>24hrs) due to surgical blood loss or risk of bleeding:  not applicable  Please fax a copy of this op note to my office at [phone] (please only include page 1 and 2 of the Case Information op note)

## 2016-05-01 DIAGNOSIS — R7309 Other abnormal glucose: Secondary | ICD-10-CM | POA: Insufficient documentation

## 2016-05-01 DIAGNOSIS — I2510 Atherosclerotic heart disease of native coronary artery without angina pectoris: Secondary | ICD-10-CM | POA: Insufficient documentation

## 2016-05-01 HISTORY — DX: Other abnormal glucose: R73.09

## 2017-05-03 DIAGNOSIS — H6993 Unspecified Eustachian tube disorder, bilateral: Secondary | ICD-10-CM

## 2017-05-03 DIAGNOSIS — H6983 Other specified disorders of Eustachian tube, bilateral: Secondary | ICD-10-CM | POA: Insufficient documentation

## 2017-05-03 HISTORY — DX: Other specified disorders of eustachian tube, bilateral: H69.83

## 2017-05-03 HISTORY — DX: Unspecified eustachian tube disorder, bilateral: H69.93

## 2017-09-28 IMAGING — CR DG CHEST 2V
2 series · 2 of 2 positions shown · non-contrast
Comparison: None

CLINICAL DATA: Preop right knee surgery

EXAM:
CHEST  2 VIEW

[w chest pa]
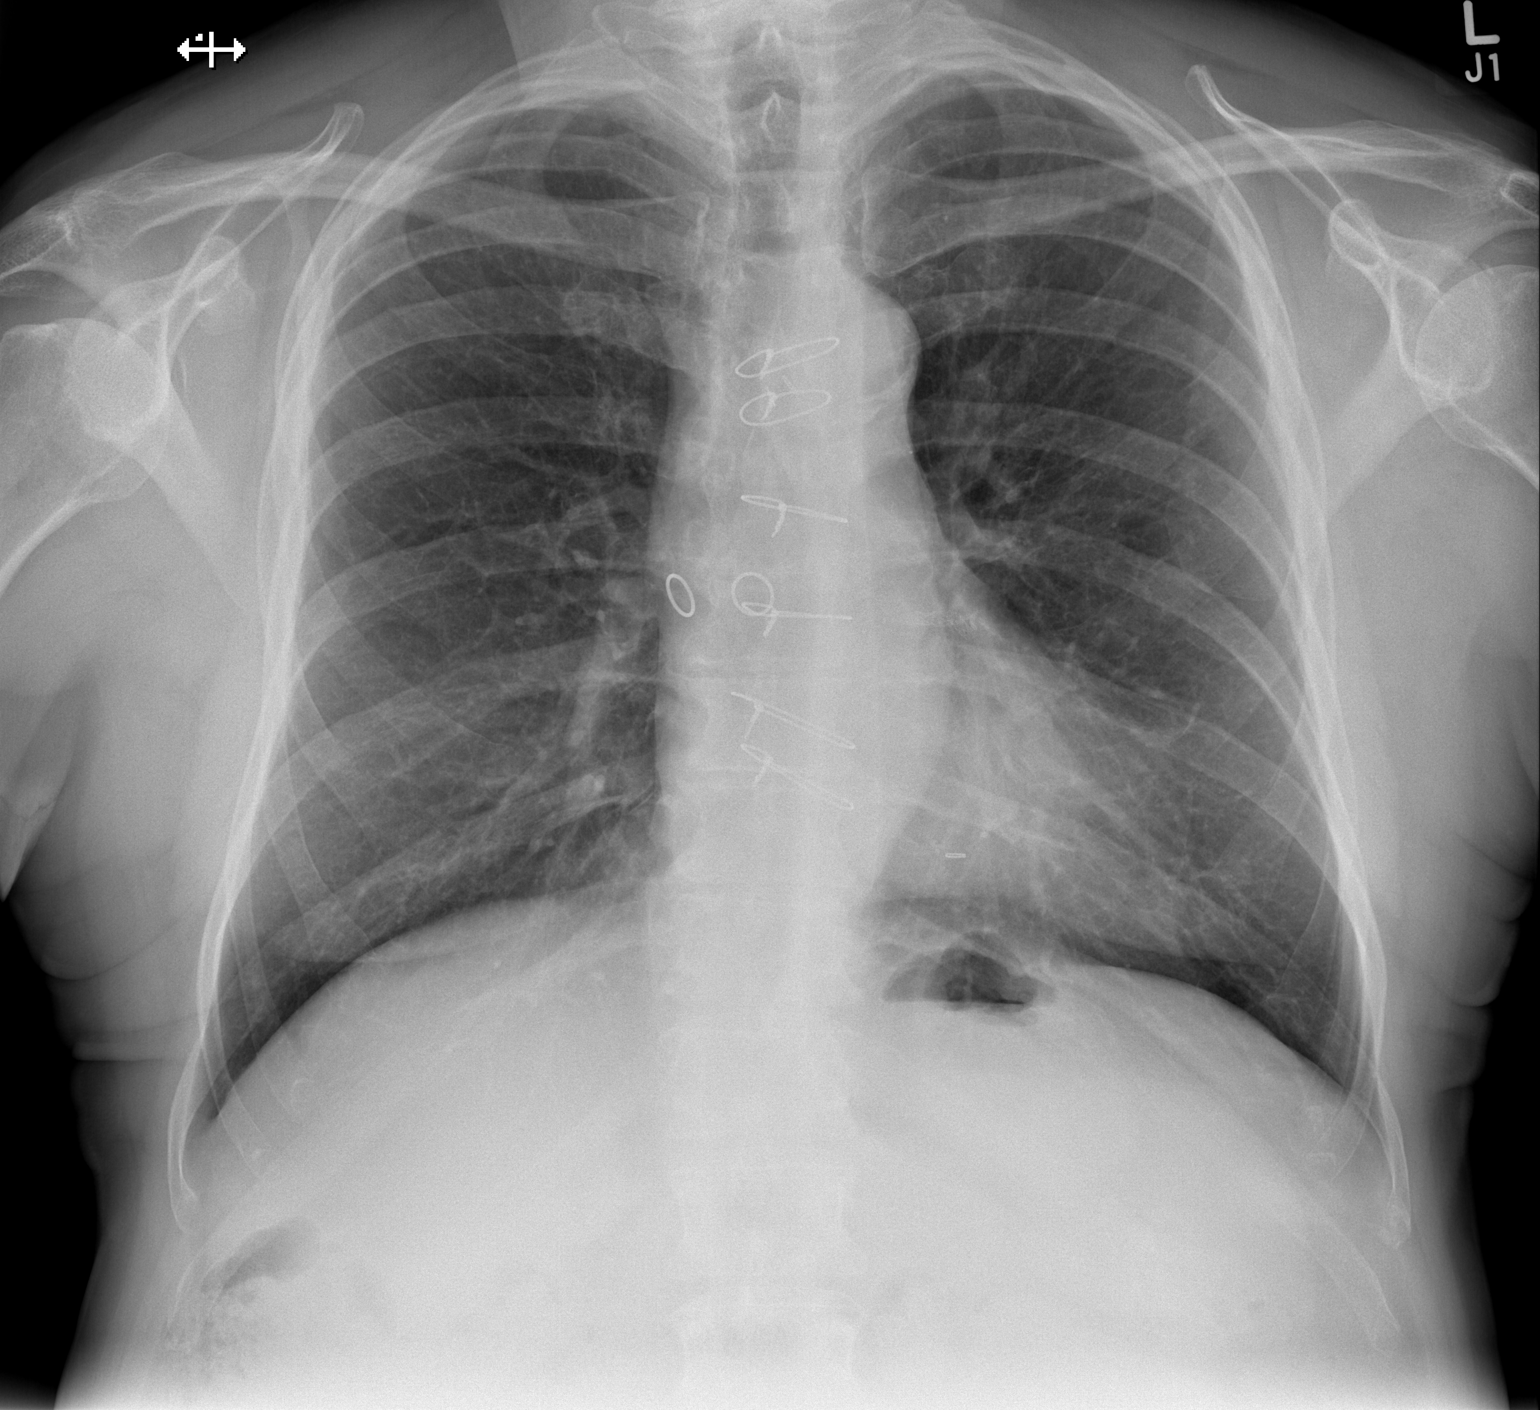

[w chest lat]
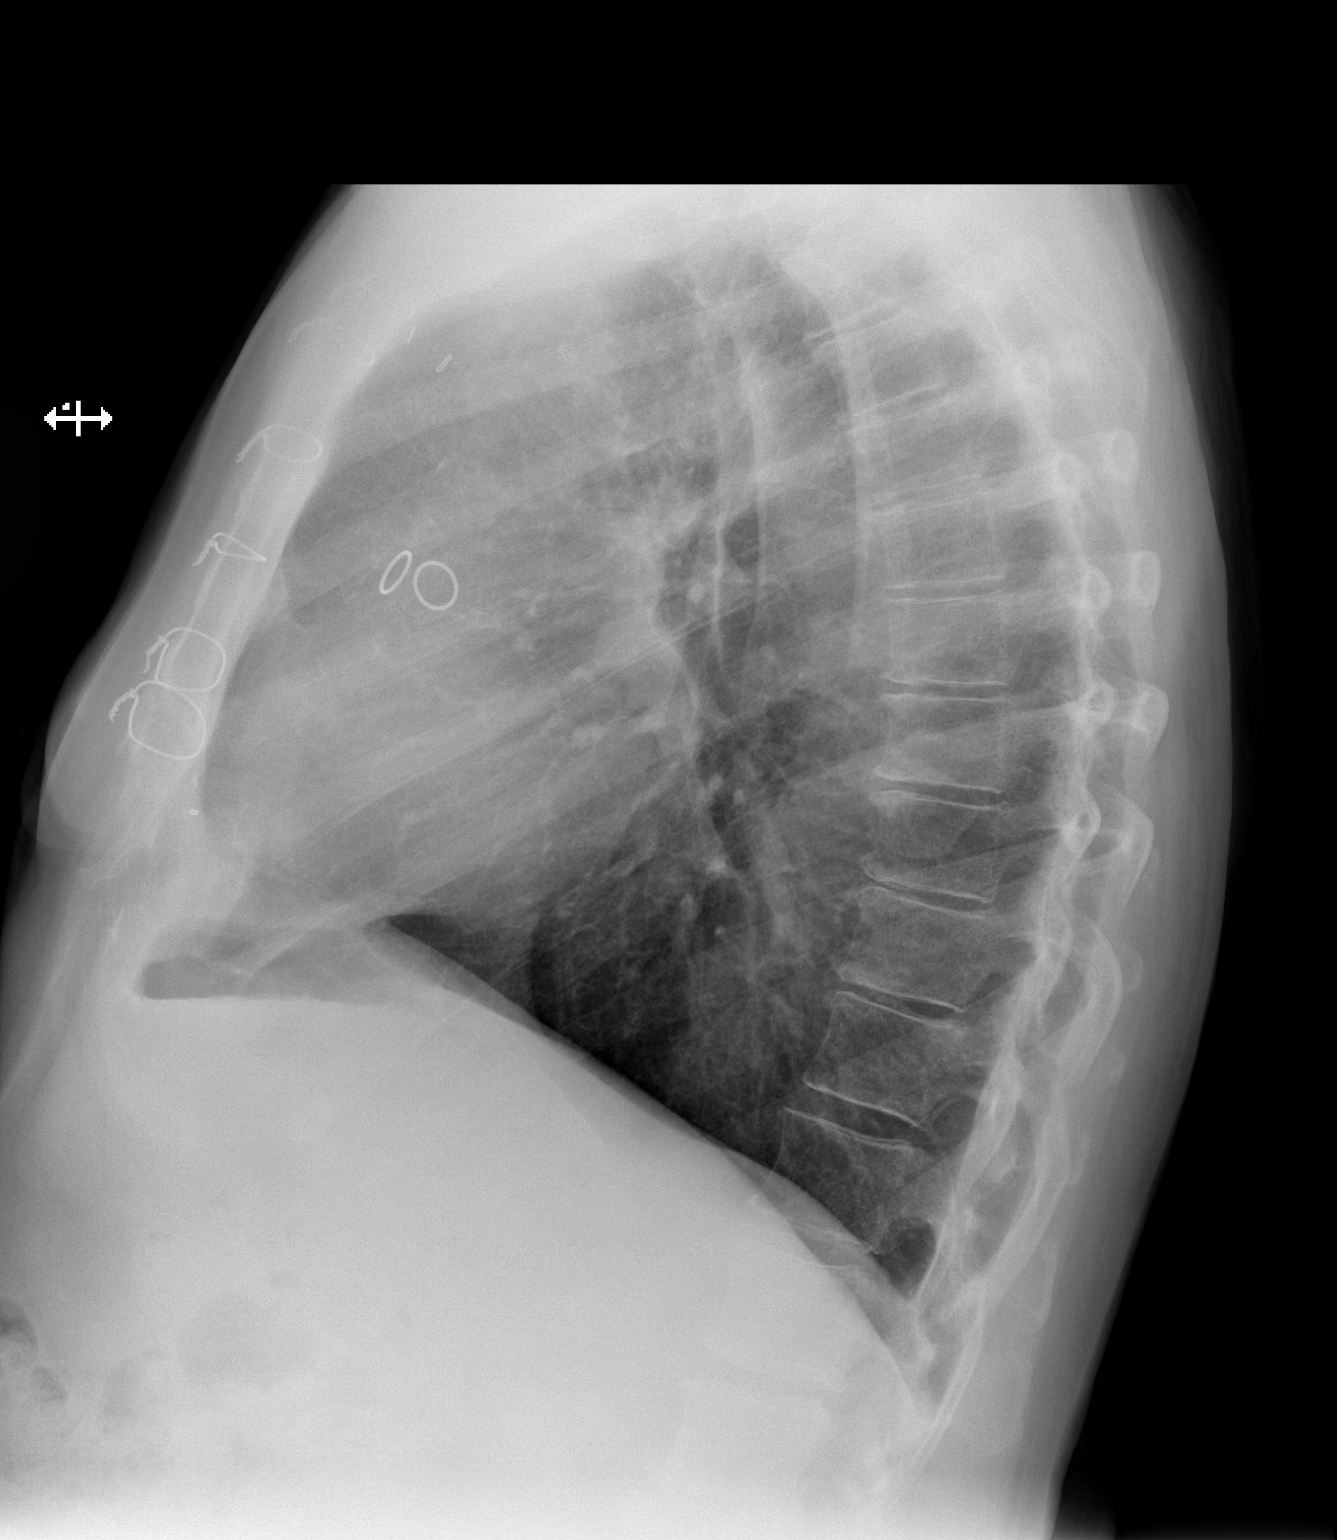

[2 of 2 positions shown; findings below may reference images not displayed]

FINDINGS: Previous median sternotomy and CABG procedure. The heart size and
mediastinal contours are within normal limits. Both lungs are clear.
The visualized skeletal structures are unremarkable.
IMPRESSION: No active cardiopulmonary disease.

## 2018-11-14 DIAGNOSIS — R7301 Impaired fasting glucose: Secondary | ICD-10-CM | POA: Insufficient documentation

## 2018-11-14 HISTORY — DX: Impaired fasting glucose: R73.01

## 2019-08-05 DIAGNOSIS — R7303 Prediabetes: Secondary | ICD-10-CM | POA: Insufficient documentation

## 2019-08-05 DIAGNOSIS — I10 Essential (primary) hypertension: Secondary | ICD-10-CM

## 2019-08-05 DIAGNOSIS — Z951 Presence of aortocoronary bypass graft: Secondary | ICD-10-CM | POA: Insufficient documentation

## 2019-08-05 DIAGNOSIS — E7800 Pure hypercholesterolemia, unspecified: Secondary | ICD-10-CM

## 2019-08-05 HISTORY — DX: Essential (primary) hypertension: I10

## 2019-08-05 HISTORY — DX: Prediabetes: R73.03

## 2019-08-05 HISTORY — DX: Pure hypercholesterolemia, unspecified: E78.00

## 2019-08-05 HISTORY — DX: Presence of aortocoronary bypass graft: Z95.1

## 2019-08-06 NOTE — Progress Notes (Signed)
Cardiology Office Note:    Date:  08/07/2019   ID:  Gabriel Wade, DOB 11/25/1940, MRN [id number]  PCP:  [place]  Cardiologist:  Shirlee More, MD   Referring MD: Janie Morning, DO  ASSESSMENT:    1. Coronary artery disease of native artery of native heart with stable angina pectoris ([place])   2. Hypercholesterolemia   3. Right bundle branch block   4. Essential (primary) hypertension    PLAN:    In order of problems listed above:  1. Stable continue treatment including ACE inhibitor aspirin and statin.  Is a follow-up through his PCP office. 2. He will continue his statin with CAD 3. Stable EKG pattern 4. Stable continue current treatment ACE inhibitor thiazide diuretic  Next appointment 1 year   Medication Adjustments/Labs and Tests Ordered: Current medicines are reviewed at length with the patient today.  Concerns regarding medicines are outlined above.  No orders of the defined types were placed in this encounter.  No orders of the defined types were placed in this encounter.    Chief Complaint  Patient presents with  . New Patient (Initial Visit)    To reestablish cardiology care last seen June 2017    History of Present Illness:    Gabriel Wade is a 79 y.o. male who is being seen today to reestablish cardiology care at the request of the patient He was last seen by me 12/10/2015.  He has a history of CAD with bypass surgery 2001 devious PCI right bundle branch block along with dyslipidemia and hypertension.  His last ischemia evaluation was 2014.  Gabriel Wade has good healthcare literacy unfortunately has gained weight and is less active because of knee injury and recovery from surgery.  He continues to do well and has had no angina shortness of breath palpitation or syncope.  He is followed in a [facility] practice.  Labs 05/28/2019 K5.5 creatinine 108 LDL 94 TSH normal  Past Medical History:  Diagnosis Date  . Arthritis    . Coronary artery disease   . Hyperlipemia   . Hypertension   . 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 APPENDECTOMY    . CARDIAC CATHETERIZATION     X 1 stent  . CORONARY ANGIOPLASTY    . CORONARY ARTERY BYPASS GRAFT    . PARTIAL KNEE ARTHROPLASTY Right 01/24/2016   Procedure: UNICOMPARTMENTAL KNEE;  Surgeon: Vickey Huger, MD;  Location: [facility];  Service: Orthopedics;  Laterality: Right;  . TONSILLECTOMY      Current Medications: Current Meds  Medication Sig  . aspirin 325 MG tablet Take 1 tablet (325 mg total) by mouth daily.  . Coconut Oil 1000 MG CAPS Take 1,000 mg by mouth daily.  . Garlic 1324 MG CAPS Take 1,000 mg by mouth daily.  . hydrochlorothiazide (HYDRODIURIL) 25 MG tablet Take 25 mg by mouth daily.  Marland Kitchen lisinopril (ZESTRIL) 20 MG tablet Take by mouth.     Allergies:   Plavix [clopidogrel bisulfate]   Social History   Socioeconomic History  . Marital status: Single    Spouse name: Not on file  . Number of children: Not on file  . Years of education: Not on file  . Highest education level: Not on file  Occupational History  . Not on file  Tobacco Use  . Smoking status: Never Smoker  Substance and Sexual Activity  . Alcohol use: No  . Drug  use: No  . Sexual activity: Not on file  Other Topics Concern  . Not on file  Social History Narrative  . Not on file   Social Determinants of Health   Financial Resource Strain:   . Difficulty of Paying Living Expenses: Not on file  Food Insecurity:   . Worried About Programme researcher, broadcasting/film/video in the Last Year: Not on file  . Ran Out of Food in the Last Year: Not on file  Transportation Needs:   . Lack of Transportation (Medical): Not on file  . Lack of Transportation (Non-Medical): Not on file  Physical Activity:   . Days of Exercise per Week: Not on file  . Minutes of Exercise per Session: Not on file  Stress:   . Feeling of Stress : Not on file  Social  Connections:   . Frequency of Communication with Friends and Family: Not on file  . Frequency of Social Gatherings with Friends and Family: Not on file  . Attends Religious Services: Not on file  . Active Member of Clubs or Organizations: Not on file  . Attends Banker Meetings: Not on file  . Marital Status: Not on file     Family History: History is noteworthy for CAD father ROS:   Review of Systems  Constitution: Negative.  HENT: Negative.   Eyes: Negative.   Cardiovascular: Negative.   Respiratory: Negative.   Endocrine: Negative.   Hematologic/Lymphatic: Negative.   Skin: Negative.   Musculoskeletal: Positive for joint pain.  Gastrointestinal: Negative.   Genitourinary: Negative.   Neurological: Negative.   Psychiatric/Behavioral: Negative.   Allergic/Immunologic: Negative.    Please see the history of present illness.     All other systems reviewed and are negative.  EKGs/Labs/Other Studies Reviewed:    The following studies were reviewed today:   EKG:  EKG is  ordered today.  The ekg ordered today is personally reviewed and demonstrates this rhythm right bundle branch block left axis deviation but not left anterior hemiblock  Recent Labs: No results found for requested labs within last 8760 hours.  Recent Lipid Panel No results found for: CHOL, TRIG, HDL, CHOLHDL, VLDL, LDLCALC, LDLDIRECT  Physical Exam:    VS:  Ht 5' 6.5" (1.689 m)   Wt 177 lb (80.3 kg)   BMI 28.14 kg/m     Wt Readings from Last 3 Encounters:  08/07/19 177 lb (80.3 kg)  01/24/16 175 lb (79.4 kg)  01/13/16 175 lb (79.4 kg)     GEN:  Well nourished, well developed in no acute distress HEENT: Normal NECK: No JVD; No carotid bruits LYMPHATICS: No lymphadenopathy CARDIAC: RRR, no murmurs, rubs, gallops RESPIRATORY:  Clear to auscultation without rales, wheezing or rhonchi  ABDOMEN: Soft, non-tender, non-distended MUSCULOSKELETAL:  No edema; No deformity  SKIN: Warm and  dry NEUROLOGIC:  Alert and oriented x 3 PSYCHIATRIC:  Normal affect     Signed, Norman Herrlich, MD  08/07/2019 9:55 AM    [facility]

## 2019-08-07 ENCOUNTER — Ambulatory Visit (INDEPENDENT_AMBULATORY_CARE_PROVIDER_SITE_OTHER): Payer: Medicare HMO | Admitting: Cardiology

## 2019-08-07 ENCOUNTER — Other Ambulatory Visit: Payer: Self-pay

## 2019-08-07 ENCOUNTER — Encounter: Payer: Self-pay | Admitting: Cardiology

## 2019-08-07 VITALS — BP 128/70 | HR 94 | Ht 66.5 in | Wt 177.0 lb

## 2019-08-07 DIAGNOSIS — I25118 Atherosclerotic heart disease of native coronary artery with other forms of angina pectoris: Secondary | ICD-10-CM | POA: Diagnosis not present

## 2019-08-07 DIAGNOSIS — I10 Essential (primary) hypertension: Secondary | ICD-10-CM

## 2019-08-07 DIAGNOSIS — E7800 Pure hypercholesterolemia, unspecified: Secondary | ICD-10-CM | POA: Diagnosis not present

## 2019-08-07 DIAGNOSIS — I4510 Unspecified right bundle-branch block: Secondary | ICD-10-CM

## 2019-08-07 NOTE — Patient Instructions (Signed)
Medication Instructions:  No change *If you need a refill on your cardiac medications before your next appointment, please call your pharmacy*  Lab Work: none If you have labs (blood work) drawn today and your tests are completely normal, you will receive your results only by: Marland Kitchen MyChart Message (if you have MyChart) OR . A paper copy in the mail If you have any lab test that is abnormal or we need to change your treatment, we will call you to review the results.  Testing/Procedures: none  Follow-Up: At [facility], you and your health needs are our priority.  As part of our continuing mission to provide you with exceptional heart care, we have created designated Provider Care Teams.  These Care Teams include your primary Cardiologist (physician) and Advanced Practice Providers (APPs -  Physician Assistants and Nurse Practitioners) who all work together to provide you with the care you need, when you need it.  Your next appointment:   1 year(s)  The format for your next appointment:   Either In Person or Virtual  Provider:   Norman Herrlich, MD  Other Instructions   Stay Well

## 2020-07-19 DIAGNOSIS — E785 Hyperlipidemia, unspecified: Secondary | ICD-10-CM | POA: Insufficient documentation

## 2020-07-19 DIAGNOSIS — I10 Essential (primary) hypertension: Secondary | ICD-10-CM | POA: Insufficient documentation

## 2020-07-19 DIAGNOSIS — M1990 Unspecified osteoarthritis, unspecified site: Secondary | ICD-10-CM | POA: Insufficient documentation

## 2020-07-26 ENCOUNTER — Ambulatory Visit: Payer: Medicare HMO | Admitting: Cardiology

## 2020-08-05 ENCOUNTER — Ambulatory Visit: Payer: Medicare HMO | Admitting: Cardiology

## 2020-08-05 ENCOUNTER — Other Ambulatory Visit: Payer: Self-pay

## 2020-08-05 ENCOUNTER — Encounter: Payer: Self-pay | Admitting: Cardiology

## 2020-08-05 VITALS — BP 134/70 | HR 102 | Ht 66.5 in | Wt 185.2 lb

## 2020-08-05 DIAGNOSIS — I4510 Unspecified right bundle-branch block: Secondary | ICD-10-CM | POA: Diagnosis not present

## 2020-08-05 DIAGNOSIS — I10 Essential (primary) hypertension: Secondary | ICD-10-CM

## 2020-08-05 DIAGNOSIS — E7800 Pure hypercholesterolemia, unspecified: Secondary | ICD-10-CM

## 2020-08-05 DIAGNOSIS — I25118 Atherosclerotic heart disease of native coronary artery with other forms of angina pectoris: Secondary | ICD-10-CM | POA: Diagnosis not present

## 2020-08-05 MED ORDER — NITROGLYCERIN 0.4 MG SL SUBL: 0.4000 mg | SUBLINGUAL_TABLET | SUBLINGUAL | 3 refills | Status: AC | PRN

## 2020-08-05 MED ORDER — ASPIRIN EC 81 MG PO TBEC: 81.0000 mg | DELAYED_RELEASE_TABLET | Freq: Every day | ORAL | 3 refills | Status: AC

## 2020-08-05 NOTE — Patient Instructions (Addendum)
Medication Instructions:  Your physician has recommended you make the following change in your medication:  DECREASE: Aspirin 81 mg take one tablet by mouth daily.  START: Nitroglycerin 0.4 mg take one tablet by mouth every 5 minutes as needed for chest pain up to three times.   *If you need a refill on your cardiac medications before your next appointment, please call your pharmacy*   Lab Work: None If you have labs (blood work) drawn today and your tests are completely normal, you will receive your results only by: Marland Kitchen MyChart Message (if you have MyChart) OR . A paper copy in the mail If you have any lab test that is abnormal or we need to change your treatment, we will call you to review the results.   Testing/Procedures: None   Follow-Up: At [facility], you and your health needs are our priority.  As part of our continuing mission to provide you with exceptional heart care, we have created designated Provider Care Teams.  These Care Teams include your primary Cardiologist (physician) and Advanced Practice Providers (APPs -  Physician Assistants and Nurse Practitioners) who all work together to provide you with the care you need, when you need it.  We recommend signing up for the patient portal called "MyChart".  Sign up information is provided on this After Visit Summary.  MyChart is used to connect with patients for Virtual Visits (Telemedicine).  Patients are able to view lab/test results, encounter notes, upcoming appointments, etc.  Non-urgent messages can be sent to your provider as well.   To learn more about what you can do with MyChart, go to [email or web address].    Your next appointment:   1 year(s)  The format for your next appointment:   In Person  Provider:   Norman Herrlich, MD   Other Instructions

## 2020-08-05 NOTE — Progress Notes (Incomplete)
Cardiology Office Note:    Date:  08/06/2020   ID:  Gabriel Wade, DOB [birth year], MRN [id number]  PCP:  [facility]  Cardiologist:  Gabriel Herrlich, MD    Referring MD: [facility]    ASSESSMENT:    1. Coronary artery disease of native artery of native heart with stable angina pectoris (HCC)   2. Hypercholesterolemia   3. Right bundle branch block   4. Essential (primary) hypertension    PLAN:    In order of problems listed above:  1. Stable CAD.  He has had a good long-term durable result from remote bypass surgery more than 2 decades ago on current medications has not had any chest pain and has recently started exercise without issues.  Taking ASA 325 mg will decrease to 81 mg to reduce risk of bleeding.  Will order Nitro to use as needed.  Continue other medical therapy including antihypertensives 2. High risk with CAD lipids at target continue Zocor 40 mg daily, labs from his PCP are unavailable 3K PN and I will request them today and amend his note when they are received  05/20/2020 CMP with potassium 5.3 creatinine 1.27 GFR 54 cc Total cholesterol 138 LDL 66 triglyceride 140 HDL 40 non-HDL cholesterol 84 lipids are at target  3. EKG remains unchanged stable pattern 4. BP at goal.  Continue current antihypertensive.    Next appointment: 1 year   Medication Adjustments/Labs and Tests Ordered: Current medicines are reviewed at length with the patient today.  Concerns regarding medicines are outlined above.  No orders of the defined types were placed in this encounter.  Meds ordered this encounter  Medications  . nitroGLYCERIN (NITROSTAT) 0.4 MG SL tablet    Sig: Place 1 tablet (0.4 mg total) under the tongue every 5 (five) minutes as needed for chest pain.    Dispense:  90 tablet    Refill:  3  . aspirin EC 81 MG tablet    Sig: Take 1 tablet (81 mg total) by mouth daily. Swallow whole.    Dispense:  90 tablet    Refill:  3     Chief Complaint  Patient presents with  . Follow-up  . Coronary Artery Disease    History of Present Illness:    Gabriel Wade is a 80 y.o. male with a hx of CAD with CABG 2001 right bundle branch block hyperlipidemia and hypertension last seen 08/07/2019. Compliance with diet, lifestyle and medications: yes  [name] presents to clinic today for his yearly visit.  He has no acute concerns today.  He denies any shortness of breath, chest pain or palpitations.  He reports that he recently started walking again,up to 20 miles some days.  His goal is to loose 12 lbs this year.  His PCP had recently decreased his HCTZ and switched his Lisinopril to Losartan which he has been tolerating well.   He tolerates lipid-lowering therapy without muscle pain or weakness. Past Medical History:  Diagnosis Date  . Arthritis   . 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 Coronary artery disease   . Dysfunction of both eustachian tubes 05/03/2017  . Elevated fasting glucose 11/14/2018  . Elevated hemoglobin A1c 05/01/2016  . Encounter for preprocedural cardiovascular examination 12/09/2015  . Essential (primary) hypertension 08/05/2019  . Gonalgia 11/25/2015  . Hx of CABG 08/05/2019  . Hypercholesterolemia 08/05/2019  . Hyperlipemia   . Hypertension   . Myocardial infarction [facility]) December 2000  . Prediabetes  08/05/2019  . 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 APPENDECTOMY    . CARDIAC CATHETERIZATION     X 1 stent  . CORONARY ANGIOPLASTY    . CORONARY ARTERY BYPASS GRAFT    . PARTIAL KNEE ARTHROPLASTY Right 01/24/2016   Procedure: UNICOMPARTMENTAL KNEE;  Surgeon: Dannielle Huh, MD;  Location: [facility] OR;  Service: Orthopedics;  Laterality: Right;  . TONSILLECTOMY      Current Medications: Current Meds  Medication Sig  . aspirin EC 81 MG tablet Take 1 tablet (81 mg total) by mouth daily. Swallow whole.  . Coconut Oil 1000 MG CAPS  Take 1,000 mg by mouth daily.  . Garlic 1000 MG CAPS Take 1,000 mg by mouth daily.  . hydrochlorothiazide (HYDRODIURIL) 25 MG tablet Take 12.5 mg by mouth daily.  Marland Kitchen losartan (COZAAR) 100 MG tablet Take 100 mg by mouth daily.  . Multiple Vitamin (MULTIVITAMIN) tablet Take 1 tablet by mouth daily.  . nitroGLYCERIN (NITROSTAT) 0.4 MG SL tablet Place 1 tablet (0.4 mg total) under the tongue every 5 (five) minutes as needed for chest pain.  . Omega-3 Fatty Acids (OMEGA-3 FISH OIL) 1200 MG CAPS Take 1,200 mg by mouth daily.  . simvastatin (ZOCOR) 40 MG tablet Take 40 mg by mouth daily.  Marland Kitchen Ubiquinol 100 MG CAPS Take 100 mg by mouth 2 (two) times a week.  . vitamin E 180 MG (400 UNITS) capsule Take 400 Units by mouth daily.  . [DISCONTINUED] aspirin 325 MG tablet Take 1 tablet (325 mg total) by mouth daily.     Allergies:   Plavix [clopidogrel bisulfate]   Social History   Socioeconomic History  . Marital status: Single    Spouse name: Not on file  . Number of children: Not on file  . Years of education: Not on file  . Highest education level: Not on file  Occupational History  . Not on file  Tobacco Use  . Smoking status: Former Smoker    Packs/day: 3.00    Years: 20.00    Pack years: 60.00    Types: Cigarettes    Quit date: 06/10/1999    Years since quitting: 21.1  . Smokeless tobacco: Never Used  Vaping Use  . Vaping Use: Never used  Substance and Sexual Activity  . Alcohol use: No  . Drug use: No  . Sexual activity: Not Currently  Other Topics Concern  . Not on file  Social History Narrative  . Not on file   Social Determinants of Health   Financial Resource Strain: Not on file  Food Insecurity: Not on file  Transportation Needs: Not on file  Physical Activity: Not on file  Stress: Not on file  Social Connections: Not on file     Family History: The patient's family history includes Diabetes in his mother; Heart failure in his father; Lung cancer in his  brother. ROS:   Please see the history of present illness.    All other systems reviewed and are negative.  EKGs/Labs/Other Studies Reviewed:    The following studies were reviewed today:  EKG:  EKG ordered today and personally reviewed.  The ekg ordered today demonstrates NSR  Recent Labs: No results found for requested labs within last 8760 hours.  Recent Lipid Panel No results found for: CHOL, TRIG, HDL, CHOLHDL, VLDL, LDLCALC, LDLDIRECT  Physical Exam:    VS:  BP 134/70 (BP Location: Right Arm, Patient Position: Sitting, Cuff  Size: Normal)   Pulse (!) 102   Ht 5' 6.5" (1.689 m)   Wt 185 lb 3.2 oz (84 kg)   SpO2 96%   BMI 29.44 kg/m     Wt Readings from Last 3 Encounters:  08/05/20 185 lb 3.2 oz (84 kg)  08/07/19 177 lb (80.3 kg)  01/24/16 175 lb (79.4 kg)     GEN: Well nourished, well developed in no acute distress HEENT: Normal NECK: No JVD; No carotid bruits LYMPHATICS: No lymphadenopathy CARDIAC: RRR, no murmurs, rubs, gallops, left lower extremity 2+ pitting edema RESPIRATORY:  Clear to auscultation without rales, wheezing or rhonchi  ABDOMEN: Soft, non-tender, non-distended MUSCULOSKELETAL:  No edema; No deformity  SKIN: Warm and dry NEUROLOGIC:  Alert and oriented x 3 PSYCHIATRIC:  Normal affect    Signed, Gabriel Herrlich, MD  08/06/2020 7:50 AM    [facility]

## 2020-08-06 NOTE — Addendum Note (Signed)
Addended by: Smiley Houseman B on: 08/06/2020 01:13 PM   Modules accepted: Orders

## 2021-07-31 NOTE — Progress Notes (Signed)
Cardiology Office Note:    Date:  08/01/2021   ID:  Gabriel Wade, DOB 05/31/1941, MRN [id number]  PCP:  [facility]  Cardiologist:  Norman Herrlich, MD    Referring MD: [facility]    ASSESSMENT:    1. Coronary artery disease of native artery of native heart with stable angina pectoris (HCC)   2. Essential (primary) hypertension   3. Hypercholesterolemia   4. Right bundle branch block    PLAN:    In order of problems listed above:  [place] continues to do well including multiple implants greater than 20 years ago having no angina on current medical therapy New York Heart Association class I we will continue treatment including aspirin antihypertensives and a statin At target continue treatment ARB thiazide diuretic LDL target continue with statin Stable EKG pattern without progressive conduction system delay   Next appointment: 1 year   Medication Adjustments/Labs and Tests Ordered: Current medicines are reviewed at length with the patient today.  Concerns regarding medicines are outlined above.  Orders Placed This Encounter  Procedures   EKG 12-Lead   No orders of the defined types were placed in this encounter.   Chief Complaint  Patient presents with   Follow-up   Coronary Artery Disease    History of Present Illness:    Gabriel Wade is a 81 y.o. 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  Compliance with diet, lifestyle and medications: yes  He has had a good year remains active no angina dyspnea palpitations or syncope. 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   Coronary artery disease    Dysfunction of both eustachian tubes 05/03/2017   Elevated fasting glucose 11/14/2018   Elevated hemoglobin A1c 05/01/2016   Encounter for preprocedural cardiovascular examination 12/09/2015   Essential (primary) hypertension 08/05/2019   Gonalgia 11/25/2015   Hx of CABG 08/05/2019   Hypercholesterolemia 08/05/2019   Hyperlipemia    Hypertension    Myocardial infarction [facility]) December 2000   Prediabetes 08/05/2019   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 Swallow whole.   Coconut Oil 1000 MG CAPS Take 1,000 mg by mouth daily.   Garlic 1000 MG CAPS Take 1,000 mg by mouth daily.   hydrochlorothiazide (HYDRODIURIL) 25 MG tablet Take 12.5 mg by mouth daily.   losartan (COZAAR) 100 MG tablet Take 50 mg by mouth daily. Takes 0.5 tablet ( 50 mg ) daily  Multiple Vitamin (MULTIVITAMIN) tablet Take 1 tablet by mouth daily.   nitroGLYCERIN (NITROSTAT) 0.4 MG SL tablet Place 1 tablet (0.4 mg total) under the tongue every 5 (five) minutes as needed for chest pain.   Omega-3 Fatty Acids (OMEGA-3 FISH OIL) 1200 MG CAPS Take 1,200 mg by mouth daily.   simvastatin (ZOCOR) 40 MG tablet Take 40 mg by mouth daily.   Ubiquinol 100 MG CAPS Take 100 mg by mouth 2 (two) times a week.   vitamin E 180 MG (400 UNITS) capsule Take 400 Units by mouth daily.      Allergies:   Plavix [clopidogrel bisulfate]   Social History   Socioeconomic History   Marital status: Single    Spouse name: Not on file   Number of children: Not on file   Years of education: Not on file   Highest education level: Not on file  Occupational History   Not on file  Tobacco Use   Smoking status: Former    Packs/day: 3.00    Years: 20.00    Pack years: 60.00    Types: Cigarettes    Quit date: 06/10/1999    Years since quitting: 22.1   Smokeless tobacco: Never  Vaping Use   Vaping Use: Never used  Substance and Sexual Activity   Alcohol use: No   Drug use: No   Sexual activity: Not Currently  Other Topics Concern   Not on file  Social History Narrative   Not on file   Social Determinants of Health   Financial Resource Strain: Not on file  Food Insecurity: Not on file  Transportation Needs: Not on file  Physical Activity: Not on file  Stress: Not on file  Social Connections: Not on file     Family History: The patient's family history includes Diabetes in his mother; Heart failure in his father; Lung cancer in his brother. ROS:   Please see the history of present illness.    All other systems reviewed and are negative.  EKGs/Labs/Other Studies Reviewed:    The following studies were reviewed today:  EKG:  EKG ordered today and personally reviewed.  The ekg ordered today demonstrates sinus rhythm normal PR interval right bundle branch block left axis deviation old anteroseptal MI change from last visit August 06, 2020  Recent Labs: See history  Physical Exam:    VS:  There were no vitals taken for this visit.    Wt Readings from Last 3 Encounters:  08/05/20 185 lb 3.2 oz (84 kg)  08/07/19 177 lb (80.3 kg)  01/24/16 175 lb (79.4 kg)     GEN:  Well nourished, well developed in no acute distress HEENT: Normal NECK: No JVD; No carotid bruits LYMPHATICS: No lymphadenopathy CARDIAC: RRR, no murmurs, rubs, gallops RESPIRATORY:  Clear to  auscultation without rales, wheezing or rhonchi  ABDOMEN: Soft, non-tender, non-distended MUSCULOSKELETAL:  No edema; No deformity  SKIN: Warm and dry NEUROLOGIC:  Alert and oriented x 3 PSYCHIATRIC:  Normal affect    Signed, Norman Herrlich, MD  08/01/2021 1:14 PM    [facility]

## 2021-08-01 ENCOUNTER — Ambulatory Visit: Payer: Medicare HMO | Admitting: Cardiology

## 2021-08-01 ENCOUNTER — Other Ambulatory Visit: Payer: Self-pay

## 2021-08-01 ENCOUNTER — Encounter: Payer: Self-pay | Admitting: Cardiology

## 2021-08-01 DIAGNOSIS — I25118 Atherosclerotic heart disease of native coronary artery with other forms of angina pectoris: Secondary | ICD-10-CM

## 2021-08-01 DIAGNOSIS — I4510 Unspecified right bundle-branch block: Secondary | ICD-10-CM

## 2021-08-01 DIAGNOSIS — I10 Essential (primary) hypertension: Secondary | ICD-10-CM | POA: Diagnosis not present

## 2021-08-01 DIAGNOSIS — E7800 Pure hypercholesterolemia, unspecified: Secondary | ICD-10-CM | POA: Diagnosis not present

## 2021-08-01 NOTE — Patient Instructions (Signed)
Medication Instructions:  Your physician recommends that you continue on your current medications as directed. Please refer to the Current Medication list given to you today.  *If you need a refill on your cardiac medications before your next appointment, please call your pharmacy*   Lab Work: None If you have labs (blood work) drawn today and your tests are completely normal, you will receive your results only by: [place] (if you have MyChart) OR A paper copy in the mail If you have any lab test that is abnormal or we need to change your treatment, we will call you to review the results.   Testing/Procedures: None   Follow-Up: At [facility], you and your health needs are our priority.  As part of our continuing mission to provide you with exceptional heart care, we have created designated Provider Care Teams.  These Care Teams include your primary Cardiologist (physician) and Advanced Practice Providers (APPs -  Physician Assistants and Nurse Practitioners) who all work together to provide you with the care you need, when you need it.  We recommend signing up for the patient portal called "MyChart".  Sign up information is provided on this After Visit Summary.  MyChart is used to connect with patients for Virtual Visits (Telemedicine).  Patients are able to view lab/test results, encounter notes, upcoming appointments, etc.  Non-urgent messages can be sent to your provider as well.   To learn more about what you can do with MyChart, go to [email or web address].    Your next appointment:   1 year(s)  The format for your next appointment:   IN person  Provider:   Shirlee More, MD    Other Instructions

## 2022-09-20 LAB — EXTERNAL GENERIC LAB PROCEDURE: COLOGUARD: NEGATIVE

## 2022-09-20 LAB — COLOGUARD: COLOGUARD: NEGATIVE

## 2023-04-09 ENCOUNTER — Telehealth: Payer: Self-pay | Admitting: Cardiology

## 2023-04-09 NOTE — Telephone Encounter (Signed)
   Name: Gabriel Wade  DOB: [birth year]  MRN: [id number]  Primary Cardiologist: None  Chart reviewed as part of pre-operative protocol coverage. Because of [name]'s past medical history and time since last visit, he will require a follow-up in-office visit in order to better assess preoperative cardiovascular risk.  Pre-op covering staff: - Please schedule appointment and call patient to inform them. If patient already had an upcoming appointment within acceptable timeframe, please add "pre-op clearance" to the appointment notes so provider is aware. - Please contact requesting surgeon's office via preferred method (i.e, phone, fax) to inform them of need for appointment prior to surgery.  Napoleon Form, Leodis Rains, NP  04/09/2023, 2:47 PM

## 2023-04-09 NOTE — Telephone Encounter (Signed)
   Pre-operative Risk Assessment    Patient Name: Gabriel Wade  DOB: 1941-03-27 MRN: [id number]      Request for Surgical Clearance    Procedure:   LT PARTIAL KNEE REPLACEMENT  Date of Surgery:  Clearance TBD                                 Surgeon:  Raymon Mutton MD Surgeon's Group or Practice Name:  [facility] Phone number:  [phone] Fax number:  [phone]   Type of Clearance Requested:   - Medical  - Pharmacy:  Hold ANY MEDICATIONS REQUIRED TO BE HELD?      Type of Anesthesia:  Spinal   Additional requests/questions:  Please advise surgeon/provider what medications should be held. AND MEDICAL CLEARANCE FROM CARDIAC STANDPOINT  Signed, Cordie Grice   04/09/2023, 2:09 PM

## 2023-04-09 NOTE — Telephone Encounter (Addendum)
I will send message to [facility] scheduling team to see if they be able to call the pt with an appt for pre op clearance.   I did try to call the pt as well and left message to call back to schedule an appt in office for pre op clearance.

## 2023-04-09 NOTE — Telephone Encounter (Addendum)
I will update all parties involved pt has appt 04/13/23 with Wallis Bamberg, NP.

## 2023-04-12 NOTE — Progress Notes (Signed)
Cardiology Office Note:  .   Date:  04/13/2023  ID:  Gabriel Wade, DOB [birth year], MRN [id number] PCP: [facility]  [facility] Providers Cardiologist:  None    History of Present Illness: Gabriel Wade is a 82 y.o. male with a past medical history of bifascicular block, HTN, CAD s/p CABG x 3 2001, dyslipidemia.  Most recently evaluated by Dr. Dulce Sellar on 08/01/2021, he was doing well from a cardiac perspective, he was advised to follow-up in 1 year, sooner if needed.  He has been doing well from a cardiac perspective, offers no formal complaints.  He stays very active at home, push mows part of his yard.  Has upcoming left partial knee replacement, is looking forward to this as he has been in a significant amount of pain. He denies chest pain, palpitations, dyspnea, pnd, orthopnea, n, v, dizziness, syncope, edema, weight gain, or early satiety.  ROS: Review of Systems  Musculoskeletal:  Positive for joint pain.  All other systems reviewed and are negative.    Studies Reviewed: .            Risk Assessment/Calculations:             Physical Exam:   VS:  BP 132/70 (BP Location: Right Arm, Patient Position: Sitting, Cuff Size: Normal)   Pulse 95   Ht 5\' 7"  (1.702 m)   Wt 187 lb 12.8 oz (85.2 kg)   SpO2 97%   BMI 29.41 kg/m    Wt Readings from Last 3 Encounters:  04/13/23 187 lb 12.8 oz (85.2 kg)  08/05/20 185 lb 3.2 oz (84 kg)  08/07/19 177 lb (80.3 kg)    GEN: Well nourished, well developed in no acute distress NECK: No JVD; No carotid bruits CARDIAC: RRR, 2/6 systolic murmur, rubs, gallops RESPIRATORY:  Clear to auscultation without rales, wheezing or rhonchi  ABDOMEN: Soft, non-tender, non-distended EXTREMITIES:  No edema; No deformity   ASSESSMENT AND PLAN: .   Coronary artery disease-s/p CABG x 3 in 2001 at [facility], Stable with no anginal symptoms. No indication for ischemic evaluation.  He has had two stress  evaluations since 2017, however I do not have access to these records--one was in [place] the other at [place] but I cannot find results of this.  Continue aspirin 81 mg daily, continue nitroglycerin as needed-as has not needed, continue Zocor 40 mg daily.  Murmur-slight systolic murmur appreciated, I cannot find any evidence of a recent echocardiogram so we will repeat this to assess for any valvular abnormalities.  He does not need to have this done prior to his knee surgery as he is completely asymptomatic.    Bifascicular block -EKG unchanged  HTN -blood pressure is controlled at 132/70, continue HCTZ, continue losartan 50 mg daily.  HLD -formally monitored by his PCP, continue Zocor 40 mg daily, do not have access to his records but he states his PCP manages it very closely. Prefer LDL to be < 70.   Preoperative cardiovascular evaluation-upcoming knee repair with Dr. Sherlean Foot; According to the Revised Cardiac Risk Index (RCRI), his Perioperative Risk of Major Cardiac Event is (%): 0.9 His Functional Capacity in METs is: 6.05 according to the Duke Activity Status Index (DASI). Therefore, based on ACC/AHA guidelines, patient would be at acceptable risk for the planned procedure without further cardiovascular testing. I will route this recommendation to the requesting party via Epic fax function.  Regarding his aspirin therapy we  recommend discontinue throughout the perioperative process however if need to be held per surgeon's preference, can be stopped 7 days prior to the procedure and resumed as soon as possible.        Dispo: Echo, return to general cardiology in 1 year.   Signed, Flossie Dibble, NP

## 2023-04-13 ENCOUNTER — Ambulatory Visit: Payer: Medicare Other | Attending: Cardiology | Admitting: Cardiology

## 2023-04-13 ENCOUNTER — Encounter: Payer: Self-pay | Admitting: Cardiology

## 2023-04-13 VITALS — BP 132/70 | HR 95 | Ht 67.0 in | Wt 187.8 lb

## 2023-04-13 DIAGNOSIS — E7800 Pure hypercholesterolemia, unspecified: Secondary | ICD-10-CM | POA: Diagnosis not present

## 2023-04-13 DIAGNOSIS — I10 Essential (primary) hypertension: Secondary | ICD-10-CM

## 2023-04-13 DIAGNOSIS — I452 Bifascicular block: Secondary | ICD-10-CM

## 2023-04-13 DIAGNOSIS — R011 Cardiac murmur, unspecified: Secondary | ICD-10-CM

## 2023-04-13 DIAGNOSIS — I25118 Atherosclerotic heart disease of native coronary artery with other forms of angina pectoris: Secondary | ICD-10-CM

## 2023-04-13 DIAGNOSIS — Z01810 Encounter for preprocedural cardiovascular examination: Secondary | ICD-10-CM

## 2023-04-13 DIAGNOSIS — Z951 Presence of aortocoronary bypass graft: Secondary | ICD-10-CM

## 2023-04-13 DIAGNOSIS — I4510 Unspecified right bundle-branch block: Secondary | ICD-10-CM

## 2023-04-13 NOTE — Patient Instructions (Signed)
Medication Instructions:  Your physician recommends that you continue on your current medications as directed. Please refer to the Current Medication list given to you today.  *If you need a refill on your cardiac medications before your next appointment, please call your pharmacy*   Lab Work: NONE If you have labs (blood work) drawn today and your tests are completely normal, you will receive your results only by: MyChart Message (if you have MyChart) OR A paper copy in the mail If you have any lab test that is abnormal or we need to change your treatment, we will call you to review the results.   Testing/Procedures: Your physician has requested that you have an echocardiogram. Echocardiography is a painless test that uses sound waves to create images of your heart. It provides your doctor with information about the size and shape of your heart and how well your heart's chambers and valves are working. This procedure takes approximately one hour. There are no restrictions for this procedure. Please do NOT wear cologne, perfume, aftershave, or lotions (deodorant is allowed). Please arrive 15 minutes prior to your appointment time.    Follow-Up: At [facility], you and your health needs are our priority.  As part of our continuing mission to provide you with exceptional heart care, we have created designated Provider Care Teams.  These Care Teams include your primary Cardiologist (physician) and Advanced Practice Providers (APPs -  Physician Assistants and Nurse Practitioners) who all work together to provide you with the care you need, when you need it.  We recommend signing up for the patient portal called "MyChart".  Sign up information is provided on this After Visit Summary.  MyChart is used to connect with patients for Virtual Visits (Telemedicine).  Patients are able to view lab/test results, encounter notes, upcoming appointments, etc.  Non-urgent messages can be sent to your  provider as well.   To learn more about what you can do with MyChart, go to [email or web address].    Your next appointment:   1 year(s)  Provider:   Norman Herrlich, MD    Other Instructions

## 2023-04-16 ENCOUNTER — Telehealth: Payer: Self-pay | Admitting: Cardiology

## 2023-04-16 NOTE — Telephone Encounter (Signed)
See previous encounter.  Patient is following up regarding clearance. He would like to confirm it has been sent because he has not heard back from Dr. Rogue Jury office.

## 2023-04-16 NOTE — Telephone Encounter (Signed)
I s/w the pt and he has been re-assured the clearance notes from Wallis Bamberg, NP were faxed to surgeon's office. I will re-fax notes to surgeon's office.

## 2023-05-15 ENCOUNTER — Telehealth: Payer: Self-pay | Admitting: Cardiology

## 2023-05-15 NOTE — Telephone Encounter (Signed)
Called patient to reschedule his echo post cancellation and he says once he resolves things with his insurance company he will call back and get it scheduled. He does not wish for [place] to reach back out at this time but promises he definitely will get it done eventually.  Cancelling order and will reinstate once he calls back to schedule.  Thank you

## 2023-05-18 ENCOUNTER — Ambulatory Visit: Payer: Medicare Other

## 2023-07-11 HISTORY — PX: MEDIAL PARTIAL KNEE REPLACEMENT: SHX5965

## 2023-11-21 NOTE — Progress Notes (Signed)
 [address] - AMBULATORY [facility] [place] [address] [place] [address]  Follow up on chronic medical problem   Gabriel Wade is a 83 y.o. male DOB: [birth year]   Subjective  Gabriel Wade  presents for follow up visit on chronic medical problem  HPI Pt presents to office for fu on predm, hypercholesterolemia, HTN, anemia due to vit b12 def.   A1C 6.1 to 5.9 Iron 112 Ferrtiin 146 B12 659 H/H improved to 13.8/40.8  LDL 77 on statin.   Bp stable on losartan 50 mg and hydrochlorothiazide 25 mg.   __________________________________________________________________  Past Medical History:  Diagnosis Date  . Coronary artery disease   . Myocardial infarction    (CMD)   . 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 ARTERIAL BYPASS SURGERY     Procedure: ARTERIAL BYPASS SURGRY  . CORONARY ARTERY BYPASS GRAFT     Procedure: CORONARY ARTERY BYPASS GRAFT    Social History   Socioeconomic History  . Marital status: Single    Spouse name: Not on file  . Number of children: Not on file  . Years of education: Not on file  . Highest education level: Not on file  Occupational History  . Not on file  Tobacco Use  . Smoking status: Former  . Smokeless tobacco: Never  Substance and Sexual Activity  . Alcohol use: No  . Drug use: No  . Sexual activity: Not on file  Other Topics Concern  . Not on file  Social History Narrative  . Not on file   Social Drivers of Health   Food Insecurity: Low Risk  (05/16/2023)   Food vital sign   . Within the past 12 months, you worried that your food would run out before you got money to buy more: Never true   . Within the past 12 months, the food you bought just didn't last and you didn't have money to get more: Never true  Transportation Needs: No Transportation Needs (05/16/2023)   Transportation   . In the past 12 months, has  lack of reliable transportation kept you from medical appointments, meetings, work or from getting things needed for daily living? : No  Safety: Low Risk  (08/08/2023)   Safety   . How often does anyone, including family and friends, physically hurt you?: Never   . How often does anyone, including family and friends, insult or talk down to you?: Never   . How often does anyone, including family and friends, threaten you with harm?: Never   . How often does anyone, including family and friends, scream or curse at you?: Never  Living Situation: Low Risk  (05/16/2023)   Living Situation   . What is your living situation today?: I have a steady place to live   . Think about the place you live. Do you have problems with any of the following? Choose all that apply:: None/None on this list    Family History  Problem Relation Name Age of Onset  . Heart disease Father     ___________________________________________________________________  Review of Systems - all systems negative with the exception of what is listed in the HPI  Allergies  Allergen Reactions  . Clopidogrel Rash     Current Outpatient Medications:  .  alpha tocopherol (VITAMIN E) 268 mg (400 unit) capsule, Take  by mouth., Disp: , Rfl:  .  aspirin  81 mg EC tablet, Take 1 tablet (81 mg total) by mouth 2 (two) times a day. For 1 month after surgery to help prevent blood clots, Disp: 60 tablet, Rfl: 0 .  coQ10, ubiquinol, 100 mg cap, Take  by mouth., Disp: , Rfl:  .  docusate sodium (COLACE) 100 mg capsule, Take 1 capsule (100 mg total) by mouth 2 (two) times a day. Stool softener for post op constipation, Disp: 30 capsule, Rfl: 0 .  garlic 100 mg tab, Take  by mouth., Disp: , Rfl:  .  hydroCHLOROthiazide (HYDRODIURIL) 25 mg tablet, Take 1 tablet by mouth once daily, Disp: 90 tablet, Rfl: 3 .  losartan (COZAAR) 50 mg tablet, Take 1 tablet by mouth once daily, Disp: 90 tablet, Rfl: 0 .  lysine 500 mg tab, Take  by mouth., Disp: ,  Rfl:  .  methocarbamoL (ROBAXIN) 500 mg tablet, Take 1-2 tablets (500-1,000 mg total) by mouth 4 (four) times a day. This is a muscle relaxer which will help you control pain after surgery, Disp: 60 tablet, Rfl: 0 .  nitroglycerin  (NITROSTAT ) 0.4 mg SL tablet, Place 0.4 mg under the tongue every 5 (five) minutes as needed for chest pain., Disp: 25 tablet, Rfl: 2 .  omega-3 fatty acids-fish oil (One-Per-Day Omega-3) 684-1,200 mg cpDR, Take  by mouth., Disp: , Rfl:  .  ondansetron  (ZOFRAN -ODT) 4 mg disintegrating tablet, Dissolve 1 tablet (4 mg total) on tongue every 8 (eight) hours as needed for nausea or vomiting., Disp: 20 tablet, Rfl: 0 .  simvastatin (ZOCOR) 40 mg tablet, Take 1 tablet by mouth nightly, Disp: 90 tablet, Rfl: 3 .  triamcinolone (NASACORT AQ) 55 mcg spra spray, Administer  into affected nostril(s)., Disp: , Rfl:   Objective   Vitals:   11/22/23 1059  BP: 122/66  Pulse: 90  SpO2: 94%  Weight: 84.8 kg (186 lb 14.4 oz)  Height: 1.702 m (5' 7)     Body mass index is 29.27 kg/m.    GENERAL APPEARANCE:  Well developed, well groomed. No acute distress. Color good. MENTAL STATUS: Appears alert and oriented. Affect appropriate. No manic s/s.  Mood normal.   SKIN: No suspicious lesions, masses, rashes, or ulcerations.  HEAD: Normocephalic. EYES: PERRL, EOMI. Lids w/o defect, conjunctiva and sclera appear normal.  CHEST: Respirations unlabored with normal diaphragmatic excursion. Chest wall symmetric with no masses. Breath sounds clear bilaterally w/o wheezes, rubs, rales, or rhonchi.  HEART: RRR no murmurs rubs or gallops.  NEURO: Cranial nerves grossly intact.   Assessment/Plan   1. Primary hypertension  Comprehensive Metabolic Panel    2. Prediabetes  Hemoglobin A1C With Estimated Average Glucose    3. 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 Hypercholesterolemia  Lipid  Panel      Patient Instructions  A1c from 6.1 to 5.9  Iron stable b12 normal.   H/H stable.  Bp at goal.    Continue current meds at current dose.  Recheck six months after repeat labs.    No follow-ups on file.

## 2024-05-08 NOTE — Progress Notes (Unsigned)
 Cardiology Office Note:    Date:  05/09/2024   ID:  Gabriel Wade, DOB [birth year], MRN [id number]  PCP:  [facility]  Cardiologist:  Redell Leiter, MD    Referring MD: [facility]    ASSESSMENT:    1. Coronary artery disease of native artery of native heart with stable angina pectoris   2. Hx of CABG   3. Essential (primary) hypertension   4. Hypercholesterolemia   5. Bifascicular block   6. Murmur, cardiac    PLAN:    In order of problems listed above:  Doing well long-term after CABG on good medical therapy and he will continue aspirin  statin Labs have asked his PCP to do an APO B for optimization Hypertension controlled his ARB and thiazide diuretic Stable pattern on EKG rate of progression to complete heart block is about 1 %/year asymptomatic but if he has symptoms he would require a pacemaker I encouraged him to get an Apple watch contact Gabriel Wade near syncope Echocardiogram ordered regarding aortic stenosis   Next appointment: Yearly follow-up   Medication Adjustments/Labs and Tests Ordered: Current medicines are reviewed at length with the patient today.  Concerns regarding medicines are outlined above.  Orders Placed This Encounter  Procedures   EKG 12-Lead   ECHOCARDIOGRAM COMPLETE   No orders of the defined types were placed in this encounter.    History of Present Illness:    Gabriel Wade is a 83 y.o. 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  Compliance with diet, lifestyle and medications: Yes  So is good to see Gabriel Wade in the office he enjoys life and just returned from a trip to [place] walking about 4-1/2 miles per day on average no chest pain edema shortness of breath palpitation or syncope on physical examination he has a grade 1/6 to 2/6 aortic outflow murmur that  holosystolic does not radiate to the carotids and tells me other practitioners have told him it is there but has not been evaluated he is at risk for aortic stenosis He tolerates his statin without muscle pain or weakness he just had labs drawn recently in his PCP office Past Medical History:  Diagnosis Date   Arthritis    Bifascicular block 11/29/2015   Coronary artery disease    Dysfunction of both eustachian tubes 05/03/2017   Elevated fasting glucose 11/14/2018   Elevated hemoglobin A1c 05/01/2016   Encounter for preprocedural cardiovascular examination 12/09/2015   Essential (primary) hypertension 08/05/2019   Gonalgia 11/25/2015   Hx of CABG 08/05/2019   Hypercholesterolemia 08/05/2019   Hyperlipemia    Hypertension    Myocardial infarction (HCC) 06/1999   Prediabetes 08/05/2019   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 Swallow whole.   Coconut Oil 1000 MG CAPS Take 1,000 mg by mouth daily.   Garlic 1000 MG CAPS Take 1,000 mg by mouth daily.   hydrochlorothiazide (HYDRODIURIL) 25 MG tablet Take 12.5 mg by mouth daily.   losartan (COZAAR) 50 MG tablet Take 50 mg by mouth daily.   Multiple Vitamin (MULTIVITAMIN) tablet Take 1 tablet by mouth daily.   nitroGLYCERIN  (NITROSTAT ) 0.4 MG SL tablet Place 1 tablet (0.4 mg total) under the tongue  every 5 (five) minutes as needed for chest pain.   Omega-3 Fatty Acids (OMEGA-3 FISH OIL) 1200 MG CAPS Take 1,200 mg by mouth daily.   simvastatin (ZOCOR) 40 MG tablet Take 40 mg by mouth daily.   Ubiquinol 100 MG CAPS Take 100 mg by mouth 2 (two) times a week.   vitamin E 180 MG (400 UNITS) capsule Take 400 Units by mouth daily.      EKGs/Labs/Other Studies Reviewed:    The following studies were reviewed today: Recent labs 11/20/2023 CMP with a creatinine 1.14 GFR 65 cc/min sodium 137 potassium 4.3  iron saturation 38% ferritin 146 cholesterol 160 LDL 77 non-HDL cholesterol 98  EKG Interpretation Date/Time:  Friday May 09 2024 14:32:18 EDT Ventricular Rate:  96 PR Interval:  180 QRS Duration:  156 QT Interval:  396 QTC Calculation: 500 R Axis:   -52  Text Interpretation: Normal sinus rhythm Right bundle branch block Left anterior fascicular block Bifascicular block Septal infarct (cited on or before 13-Apr-2023) When compared with ECG of 13-Apr-2023 08:22, Questionable change in initial forces of Anterior leads Confirmed by Monetta Rogue ([id number]) on 05/09/2024 2:53:26 PM    Physical Exam:    VS:  BP 118/60   Pulse 96   Ht 5' 7 (1.702 m)   Wt 185 lb (83.9 kg)   SpO2 96%   BMI 28.98 kg/m     Wt Readings from Last 3 Encounters:  05/09/24 185 lb (83.9 kg)  04/13/23 187 lb 12.8 oz (85.2 kg)  08/05/20 185 lb 3.2 oz (84 kg)     GEN:  Well nourished, well developed in no acute distress HEENT: Normal NECK: No JVD; No carotid bruits LYMPHATICS: No lymphadenopathy CARDIAC: 2/6 aortic outflow murmur RRR, no murmurs, rubs, gallops RESPIRATORY:  Clear to auscultation without rales, wheezing or rhonchi  ABDOMEN: Soft, non-tender, non-distended MUSCULOSKELETAL:  No edema; No deformity  SKIN: Warm and dry NEUROLOGIC:  Alert and oriented x 3 PSYCHIATRIC:  Normal affect    Signed, Rogue Monetta, MD  05/09/2024 3:34 PM    [facility]

## 2024-05-09 ENCOUNTER — Ambulatory Visit: Attending: Cardiology | Admitting: Cardiology

## 2024-05-09 ENCOUNTER — Encounter: Payer: Self-pay | Admitting: Cardiology

## 2024-05-09 VITALS — BP 118/60 | HR 96 | Ht 67.0 in | Wt 185.0 lb

## 2024-05-09 DIAGNOSIS — I452 Bifascicular block: Secondary | ICD-10-CM

## 2024-05-09 DIAGNOSIS — E7800 Pure hypercholesterolemia, unspecified: Secondary | ICD-10-CM

## 2024-05-09 DIAGNOSIS — I25118 Atherosclerotic heart disease of native coronary artery with other forms of angina pectoris: Secondary | ICD-10-CM

## 2024-05-09 DIAGNOSIS — R011 Cardiac murmur, unspecified: Secondary | ICD-10-CM

## 2024-05-09 DIAGNOSIS — Z951 Presence of aortocoronary bypass graft: Secondary | ICD-10-CM

## 2024-05-09 DIAGNOSIS — I10 Essential (primary) hypertension: Secondary | ICD-10-CM

## 2024-05-09 NOTE — Patient Instructions (Addendum)
 Medication Instructions:  Your physician recommends that you continue on your current medications as directed. Please refer to the Current Medication list given to you today.  *If you need a refill on your cardiac medications before your next appointment, please call your pharmacy*  Lab Work: Patient will Apo B drawn at his PCP's office.  If you have labs (blood work) drawn today and your tests are completely normal, you will receive your results only by: MyChart Message (if you have MyChart) OR A paper copy in the mail If you have any lab test that is abnormal or we need to change your treatment, we will call you to review the results.  Testing/Procedures: Your physician has requested that you have an echocardiogram. Echocardiography is a painless test that uses sound waves to create images of your heart. It provides your doctor with information about the size and shape of your heart and how well your heart's chambers and valves are working. This procedure takes approximately one hour. There are no restrictions for this procedure. Please do NOT wear cologne, perfume, aftershave, or lotions (deodorant is allowed). Please arrive 15 minutes prior to your appointment time.  Please note: We ask at that you not bring children with you during ultrasound (echo/ vascular) testing. Due to room size and safety concerns, children are not allowed in the ultrasound rooms during exams. Our front office staff cannot provide observation of children in our lobby area while testing is being conducted. An adult accompanying a patient to their appointment will only be allowed in the ultrasound room at the discretion of the ultrasound technician under special circumstances. We apologize for any inconvenience.   Follow-Up: At [facility], you and your health needs are our priority.  As part of our continuing mission to provide you with exceptional heart care, our providers are all part of one team.  This  team includes your primary Cardiologist (physician) and Advanced Practice Providers or APPs (Physician Assistants and Nurse Practitioners) who all work together to provide you with the care you need, when you need it.  Your next appointment:   1 year(s)  Provider:   Redell Leiter, MD    We recommend signing up for the patient portal called MyChart.  Sign up information is provided on this After Visit Summary.  MyChart is used to connect with patients for Virtual Visits (Telemedicine).  Patients are able to view lab/test results, encounter notes, upcoming appointments, etc.  Non-urgent messages can be sent to your provider as well.   To learn more about what you can do with MyChart, go to [email or web address].   Other Instructions None        [facility]  Tips to measure your blood pressure correctly  To determine whether you have hypertension, a medical professional will take a blood pressure reading. How you prepare for the test, the position of your arm, and other factors can change a blood pressure reading by 10% or more. That could be enough to hide high blood pressure, start you on a drug you don't really need, or lead your doctor to incorrectly adjust your medications. National and international guidelines offer specific instructions for measuring blood pressure. If a doctor, nurse, or medical assistant isn't doing it right, don't hesitate to ask him or her to get with the guidelines. Here's what you can do to ensure a correct reading:  Don't drink a caffeinated beverage or smoke during the 30 minutes before the test.  Sit quietly for five minutes before  the test begins.  During the measurement, sit in a chair with your feet on the floor and your arm supported so your elbow is at about heart level.  The inflatable part of the cuff should completely cover at least 80% of your upper arm, and the cuff should be placed on bare skin, not over a shirt.  Don't talk during the  measurement.  Have your blood pressure measured twice, with a brief break in between. If the readings are different by 5 points or more, have it done a third time. There are times to break these rules. If you sometimes feel lightheaded when getting out of bed in the morning or when you stand after sitting, you should have your blood pressure checked while seated and then while standing to see if it falls from one position to the next. Because blood pressure varies throughout the day, your doctor will rarely diagnose hypertension on the basis of a single reading. Instead, he or she will want to confirm the measurements on at least two occasions, usually within a few weeks of one another. The exception to this rule is if you have a blood pressure reading of 180/110 mm Hg or higher. A result this high usually calls for prompt treatment. It's also a good idea to have your blood pressure measured in both arms at least once, since the reading in one arm (usually the right) may be higher than that in the left. A 2014 study in The American Journal of Medicine of nearly 3,400 people found average arm- to-arm differences in systolic blood pressure of about 5 points. The higher number should be used to make treatment decisions. In 2017, new guidelines from the American Heart Association, the [employer] of Cardiology, and nine other health organizations lowered the diagnosis of high blood pressure to 130/80 mm Hg or higher for all adults. The guidelines also redefined the various blood pressure categories to now include normal, elevated, Stage 1 hypertension, Stage 2 hypertension, and hypertensive crisis (see Blood pressure categories). Blood pressure categories  Blood pressure category SYSTOLIC (upper number)  DIASTOLIC (lower number)  Normal Less than 120 mm Hg and Less than 80 mm Hg  Elevated 120-129 mm Hg and Less than 80 mm Hg  High blood pressure: Stage 1 hypertension 130-139 mm Hg or 80-89 mm Hg   High blood pressure: Stage 2 hypertension 140 mm Hg or higher or 90 mm Hg or higher  Hypertensive crisis (consult your doctor immediately) Higher than 180 mm Hg and/or Higher than 120 mm Hg  Source: American Heart Association and American Stroke Association. For more on getting your blood pressure under control, buy Controlling Your Blood Pressure, a Special Health Report from [facility].

## 2024-05-13 ENCOUNTER — Encounter: Payer: Self-pay | Admitting: Cardiology

## 2024-05-27 NOTE — Progress Notes (Signed)
 [address] - AMBULATORY [facility]  - FAMILY MEDICINE [place] [address] [place] [address]  Follow up on chronic medical problem   Gabriel Wade is a 83 y.o. 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  A1C 5.9 to 6.0 LDL 77 to 82 HDL 62 to 55 on simvastatin 40 mg Kidney function WNL Anemia profile WNL H/H stable for few years now.  13.6/39.8 B12 750  Bp at goal on losartan 50 mg and hydrochlorothiazide 25 mg  Asymptomatic CAD on asa, statin.  Bp stable.    Colonoscopy: cologuard done 09/2022 No concerns today    Behavioral Health Screening  Patient Health Questionnaire-2 Score: 0 (05/28/2024 10:31 AM)      Patient's Depression screening is Negative   Depression Plan: Normal/Negative Screening  __________________________________________________________________  Medical History[1]  Surgical History[2]  Social History[3]  Family History[4] ___________________________________________________________________  Review of Systems - all systems negative with the exception of what is listed in the HPI  Allergies[5]  Current Medications[6]  Objective   Vitals:   05/28/24 1031 05/28/24 1040  BP: 140/80 128/68  Pulse: 96   SpO2: 95%   Weight: 83.5 kg (184 lb)   Height: 1.702 m (5' 7)      Body mass index is 28.82 kg/m.    GENERAL APPEARANCE:  Well developed, well groomed. No acute distress. Color good. MENTAL STATUS: Appears alert and oriented. Affect appropriate. No manic s/s.  Mood normal.   SKIN: No suspicious lesions, masses, rashes, or ulcerations.  HEAD: Normocephalic. EYES: PERRL, EOMI. Lids w/o defect, conjunctiva and sclera appear normal.  CHEST: Respirations unlabored with normal diaphragmatic excursion. Chest wall symmetric with  no masses. Breath sounds clear bilaterally w/o wheezes, rubs, rales, or rhonchi.  HEART: RRR no murmurs rubs or gallops.  EXT:  No edema/ no cyanosis. NEURO: Cranial nerves grossly intact.   Assessment/Plan   1. Coronary artery disease involving native coronary artery of native heart without angina pectoris      2. Primary hypertension  Comprehensive Metabolic Panel   CBC without Differential    3. Prediabetes  Hemoglobin A1C With Estimated Average Glucose    4. Hypercholesterolemia  Lipid Panel      Patient Instructions  HYPERTENSION  WITH CAD:  asymptomatic. Bp stable no med changes.   PREDIABETES: A1c up from 5.9 to 6.0 lifestyle modifications.  Recheck six months.   LDL < 100.  Continue statin.  Recheck six months. Lifestyle modifications.   Cologuard due in two years  No signs of low iron.  H/h stable.     No follow-ups on file.  I agree the documentation is accurate and complete.  Electronically signed by: Mickey Alberteen Hunter Mickey, PA-C 05/28/2024 10:48 AM    This document serves as a record of services personally performed by Alberteen Hunter Mickey, PA-C.  It was created on their behalf by April Colene Rumpf, CMA, a trained medical scribe, and Scientist, Forensic (CMA). During the course of documenting the history, physical exam and medical decision making, I was functioning as a stage manager. The creation of this record is the provider's dictation and/or activities during the visit.  Electronically signed by April Violet Hedge, CMA 05/27/2024 3:26 PM           [1] Past  Medical History: Diagnosis Date  . Coronary artery disease   . Myocardial infarction    (CMD)   . 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 ARTERIAL BYPASS SURGERY     Procedure: ARTERIAL BYPASS SURGRY  . CORONARY ARTERY BYPASS GRAFT     Procedure: CORONARY ARTERY BYPASS  GRAFT  [3] Social History Socioeconomic History  . Marital status: Single  Tobacco Use  . Smoking status: Former  . Smokeless tobacco: Never  Substance and Sexual Activity  . Alcohol use: No  . Drug use: No   Social Drivers of Health   Food Insecurity: Low Risk  (05/28/2024)   Food vital sign   . Within the past 12 months, you worried that your food would run out before you got money to buy more: Never true   . Within the past 12 months, the food you bought just didn't last and you didn't have money to get more: Never true  Transportation Needs: No Transportation Needs (05/28/2024)   Transportation   . In the past 12 months, has lack of reliable transportation kept you from medical appointments, meetings, work or from getting things needed for daily living? : No  Safety: Low Risk  (05/28/2024)   Safety   . How often does anyone, including family and friends, physically hurt you?: Never   . How often does anyone, including family and friends, insult or talk down to you?: Never   . How often does anyone, including family and friends, threaten you with harm?: Never   . How often does anyone, including family and friends, scream or curse at you?: Never  Living Situation: Low Risk  (05/28/2024)   Living Situation   . What is your living situation today?: I have a steady place to live   . Think about the place you live. Do you have problems with any of the following? Choose all that apply:: None/None on this list  [4] Family History Problem Relation Name Age of Onset  . Heart disease Father    [5] Allergies Allergen Reactions  . Clopidogrel Rash  [6]  Current Outpatient Medications:  .  alpha tocopherol (VITAMIN E) 268 mg (400 unit) capsule, Take  by mouth., Disp: , Rfl:  .  aspirin  81 mg EC tablet, Take 1 tablet (81 mg total) by mouth 2 (two) times a day. For 1 month after surgery to help prevent blood clots, Disp: 60 tablet, Rfl: 0 .  coQ10, ubiquinol, 100 mg cap, Take  by  mouth., Disp: , Rfl:  .  garlic 100 mg tab, Take  by mouth., Disp: , Rfl:  .  hydroCHLOROthiazide (HYDRODIURIL) 25 mg tablet, Take 1 tablet by mouth once daily, Disp: 90 tablet, Rfl: 3 .  losartan (COZAAR) 50 mg tablet, Take 1 tablet by mouth once daily, Disp: 90 tablet, Rfl: 0 .  lysine 500 mg tab, Take  by mouth., Disp: , Rfl:  .  nitroglycerin  (NITROSTAT ) 0.4 mg SL tablet, Place 0.4 mg under the tongue every 5 (five) minutes as needed for chest pain., Disp: 25 tablet, Rfl: 2 .  omega-3 fatty acids-fish oil (One-Per-Day Omega-3) 684-1,200 mg cpDR, Take  by mouth., Disp: , Rfl:  .  simvastatin (ZOCOR) 40 mg tablet, Take 1 tablet by mouth nightly, Disp: 90 tablet, Rfl: 3 .  triamcinolone (NASACORT AQ) 55 mcg spra spray, Administer  into affected nostril(s)., Disp: , Rfl:  .  docusate sodium (COLACE) 100 mg capsule, Take 1 capsule (100 mg total) by mouth 2 (two) times a day. Stool softener for post op constipation, Disp: 30 capsule, Rfl: 0 .  methocarbamoL (ROBAXIN) 500 mg tablet, Take 1-2 tablets (500-1,000 mg total) by mouth 4 (four) times a day. This is a muscle relaxer which will help you control pain after surgery, Disp: 60 tablet, Rfl: 0 .  ondansetron  (ZOFRAN -ODT) 4 mg disintegrating tablet, Dissolve 1 tablet (4 mg total) on tongue every 8 (eight) hours as needed for nausea or vomiting., Disp: 20 tablet, Rfl: 0

## 2024-06-04 ENCOUNTER — Ambulatory Visit: Attending: Cardiology

## 2024-06-04 ENCOUNTER — Encounter: Payer: Self-pay | Admitting: Cardiology

## 2024-06-04 DIAGNOSIS — I10 Essential (primary) hypertension: Secondary | ICD-10-CM | POA: Diagnosis not present

## 2024-06-04 DIAGNOSIS — I25118 Atherosclerotic heart disease of native coronary artery with other forms of angina pectoris: Secondary | ICD-10-CM

## 2024-06-04 LAB — ECHOCARDIOGRAM COMPLETE
AR max vel: 1.33 cm2
AV Area VTI: 1.19 cm2
AV Area mean vel: 1.19 cm2
AV Mean grad: 12 mmHg
AV Peak grad: 20.3 mmHg
Ao pk vel: 2.25 m/s
Area-P 1/2: 6.74 cm2
S' Lateral: 3.9 cm

## 2024-06-04 MED ORDER — SACUBITRIL-VALSARTAN 24-26 MG PO TABS: 1.0000 | ORAL_TABLET | Freq: Two times a day (BID) | ORAL | 3 refills | Status: DC

## 2024-06-06 ENCOUNTER — Encounter: Payer: Self-pay | Admitting: Cardiology

## 2024-06-10 ENCOUNTER — Telehealth: Payer: Self-pay | Admitting: Pharmacy Technician

## 2024-06-10 ENCOUNTER — Other Ambulatory Visit (HOSPITAL_COMMUNITY): Payer: Self-pay

## 2024-06-10 NOTE — Telephone Encounter (Signed)
 Pharmacy Patient Advocate Encounter  Insurance verification completed.   The patient is insured through [employer] test claim for Entresto . Currently a quantity of 60 is a 30 day supply and the co-pay is 47.00 .  AFTER deductible Patient may be eligible for a Medicare prescription Payment plan. The patient will need to reach out to their insurance company to enrol in the payment plan to spread out their payments throughout the year, If available.  This test claim was processed through [facility]- copay amounts may vary at other pharmacies due to pharmacy/plan contracts, or as the patient moves through the different stages of their insurance plan.

## 2024-06-18 ENCOUNTER — Other Ambulatory Visit: Payer: Self-pay

## 2024-06-24 DIAGNOSIS — I519 Heart disease, unspecified: Secondary | ICD-10-CM | POA: Insufficient documentation

## 2024-06-24 DIAGNOSIS — I110 Hypertensive heart disease with heart failure: Secondary | ICD-10-CM | POA: Insufficient documentation

## 2024-06-25 ENCOUNTER — Telehealth: Payer: Self-pay | Admitting: Pharmacy Technician

## 2024-06-25 ENCOUNTER — Other Ambulatory Visit (HOSPITAL_COMMUNITY): Payer: Self-pay

## 2024-06-25 MED ORDER — SACUBITRIL-VALSARTAN 24-26 MG PO TABS
1.0000 | ORAL_TABLET | Freq: Two times a day (BID) | ORAL | 3 refills | Status: DC
  Filled 2024-06-25: qty 60, 30d supply, fill #0

## 2024-06-25 NOTE — Addendum Note (Signed)
 Addended by: JOSHUA ANDREZ PARAS on: 06/25/2024 04:16 PM   Modules accepted: Orders

## 2024-06-25 NOTE — Telephone Encounter (Signed)
 Patient Advocate Encounter   The patient was approved for a [employer] grant that will help cover the cost of Entresto  Total amount awarded, 7500.00.  Effective: 05/26/24 - 05/25/25   APW:[id number] ERW:[id number] Group:[id number] PI:[id number]  [employer] ID: [id number]   Pharmacy provided with approval and processing information. Patient informed via mychart

## 2024-06-26 ENCOUNTER — Other Ambulatory Visit (HOSPITAL_COMMUNITY): Payer: Self-pay

## 2024-06-26 MED ORDER — ENTRESTO 24-26 MG PO TABS: 1.0000 | ORAL_TABLET | Freq: Two times a day (BID) | ORAL | 3 refills | Status: DC

## 2024-06-26 NOTE — Addendum Note (Signed)
 Addended by: JOSHUA ANDREZ PARAS on: 06/26/2024 10:48 AM   Modules accepted: Orders

## 2024-07-21 ENCOUNTER — Telehealth: Payer: Self-pay | Admitting: Pharmacy Technician

## 2024-07-21 ENCOUNTER — Other Ambulatory Visit (HOSPITAL_COMMUNITY): Payer: Self-pay

## 2024-07-21 MED ORDER — SACUBITRIL-VALSARTAN 24-26 MG PO TABS: 1.0000 | ORAL_TABLET | Freq: Two times a day (BID) | ORAL | 3 refills | Status: AC

## 2024-07-21 NOTE — Addendum Note (Signed)
 Addended by: Deltha Bernales D on: 07/21/2024 08:35 AM   Modules accepted: Orders

## 2024-07-21 NOTE — Telephone Encounter (Signed)
 Pharmacy Patient Advocate Encounter   Received notification from Patient Advice Request messages that prior authorization for sacubitril -valsartan  24-26 mg generic only is required/requested.   Insurance verification completed.   The patient is insured through [employer].   Per test claim: The current 07/21/24 day co-pay is, $58.39- one omnth.  No PA needed at this time. This test claim was processed through [facility]- copay amounts may vary at other pharmacies due to pharmacy/plan contracts, or as the patient moves through the different stages of their insurance plan.

## 2024-08-05 ENCOUNTER — Encounter: Payer: Self-pay | Admitting: Cardiology

## 2024-08-06 ENCOUNTER — Encounter: Payer: Self-pay | Admitting: Cardiology
